# Patient Record
Sex: Female | Born: 1960
Health system: Southern US, Community
[De-identification: ages and names within clinical notes are randomized; demographics above are authoritative.]

## PROBLEM LIST (undated history)

## (undated) DIAGNOSIS — H9191 Unspecified hearing loss, right ear: Secondary | ICD-10-CM

## (undated) DIAGNOSIS — F32A Depression, unspecified: Secondary | ICD-10-CM

## (undated) DIAGNOSIS — S32599A Other specified fracture of unspecified pubis, initial encounter for closed fracture: Secondary | ICD-10-CM

## (undated) DIAGNOSIS — E785 Hyperlipidemia, unspecified: Secondary | ICD-10-CM

## (undated) DIAGNOSIS — M1990 Unspecified osteoarthritis, unspecified site: Secondary | ICD-10-CM

## (undated) DIAGNOSIS — F410 Panic disorder [episodic paroxysmal anxiety] without agoraphobia: Secondary | ICD-10-CM

## (undated) DIAGNOSIS — D126 Benign neoplasm of colon, unspecified: Secondary | ICD-10-CM

## (undated) DIAGNOSIS — F329 Major depressive disorder, single episode, unspecified: Secondary | ICD-10-CM

## (undated) DIAGNOSIS — B019 Varicella without complication: Secondary | ICD-10-CM

## (undated) DIAGNOSIS — K219 Gastro-esophageal reflux disease without esophagitis: Secondary | ICD-10-CM

## (undated) DIAGNOSIS — C4491 Basal cell carcinoma of skin, unspecified: Secondary | ICD-10-CM

## (undated) HISTORY — DX: Hyperlipidemia, unspecified: E78.5

## (undated) HISTORY — DX: Depression, unspecified: F32.A

## (undated) HISTORY — DX: Major depressive disorder, single episode, unspecified: F32.9

## (undated) HISTORY — DX: Varicella without complication: B01.9

## (undated) HISTORY — DX: Basal cell carcinoma of skin, unspecified: C44.91

## (undated) HISTORY — DX: Benign neoplasm of colon, unspecified: D12.6

## (undated) HISTORY — DX: Gastro-esophageal reflux disease without esophagitis: K21.9

## (undated) HISTORY — DX: Unspecified hearing loss, right ear: H91.91

## (undated) HISTORY — PX: DILATION AND CURETTAGE OF UTERUS: SHX78

---

## 1998-11-11 ENCOUNTER — Other Ambulatory Visit: Admission: RE | Admit: 1998-11-11 | Discharge: 1998-11-11 | Payer: Self-pay | Admitting: Obstetrics and Gynecology

## 1999-11-16 ENCOUNTER — Other Ambulatory Visit: Admission: RE | Admit: 1999-11-16 | Discharge: 1999-11-16 | Payer: Self-pay | Admitting: Obstetrics and Gynecology

## 2000-12-21 ENCOUNTER — Other Ambulatory Visit: Admission: RE | Admit: 2000-12-21 | Discharge: 2000-12-21 | Payer: Self-pay | Admitting: Obstetrics and Gynecology

## 2001-03-14 ENCOUNTER — Other Ambulatory Visit: Admission: RE | Admit: 2001-03-14 | Discharge: 2001-03-14 | Payer: Self-pay | Admitting: Obstetrics and Gynecology

## 2002-04-12 ENCOUNTER — Other Ambulatory Visit: Admission: RE | Admit: 2002-04-12 | Discharge: 2002-04-12 | Payer: Self-pay | Admitting: Obstetrics and Gynecology

## 2003-05-02 ENCOUNTER — Other Ambulatory Visit: Admission: RE | Admit: 2003-05-02 | Discharge: 2003-05-02 | Payer: Self-pay | Admitting: Obstetrics and Gynecology

## 2004-07-10 ENCOUNTER — Other Ambulatory Visit: Admission: RE | Admit: 2004-07-10 | Discharge: 2004-07-10 | Payer: Self-pay | Admitting: Obstetrics and Gynecology

## 2004-08-04 ENCOUNTER — Observation Stay (HOSPITAL_COMMUNITY): Admission: EM | Admit: 2004-08-04 | Discharge: 2004-08-05 | Payer: Self-pay | Admitting: Emergency Medicine

## 2005-07-19 ENCOUNTER — Other Ambulatory Visit: Admission: RE | Admit: 2005-07-19 | Discharge: 2005-07-19 | Payer: Self-pay | Admitting: Obstetrics and Gynecology

## 2006-03-15 ENCOUNTER — Encounter: Admission: RE | Admit: 2006-03-15 | Discharge: 2006-03-15 | Payer: Self-pay | Admitting: Family Medicine

## 2006-08-29 ENCOUNTER — Encounter: Admission: RE | Admit: 2006-08-29 | Discharge: 2006-08-29 | Payer: Self-pay | Admitting: Obstetrics and Gynecology

## 2006-10-24 ENCOUNTER — Ambulatory Visit: Payer: Self-pay | Admitting: Family Medicine

## 2007-07-10 ENCOUNTER — Ambulatory Visit: Payer: Self-pay | Admitting: Family Medicine

## 2008-03-25 ENCOUNTER — Encounter: Admission: RE | Admit: 2008-03-25 | Discharge: 2008-03-25 | Payer: Self-pay | Admitting: Obstetrics and Gynecology

## 2009-11-07 ENCOUNTER — Encounter: Admission: RE | Admit: 2009-11-07 | Discharge: 2009-11-07 | Payer: Self-pay | Admitting: Obstetrics and Gynecology

## 2010-02-04 ENCOUNTER — Ambulatory Visit: Payer: Self-pay | Admitting: Family Medicine

## 2010-12-07 ENCOUNTER — Ambulatory Visit: Payer: Self-pay | Admitting: Family Medicine

## 2011-01-10 ENCOUNTER — Encounter: Payer: Self-pay | Admitting: Obstetrics and Gynecology

## 2011-05-07 NOTE — H&P (Signed)
NAMEMarland Kitchen  Connie Knox, Connie Knox                         ACCOUNT NO.:  [id number]   MEDICAL RECORD NO.:  [id number]                   PATIENT TYPE:  INP   LOCATION:  0103                                 FACILITY:  [facility]   PHYSICIAN:  Charlies Constable, M.D. [facility]              DATE OF BIRTH:  19-Oct-1961   DATE OF ADMISSION:  08/04/2004  DATE OF DISCHARGE:                                HISTORY & PHYSICAL   CHIEF COMPLAINT:  Chest pain.   HISTORY OF PRESENT ILLNESS:  Connie Knox is 50 years old and has no prior  history of known heart disease.  Over the past week to two weeks she has  been having chest pain which she describes as a feeling of her son standing  on her chest.  The pain is mostly left-sided and does not have any  radiation.  She has had some associated nausea with this but no diaphoresis  or shortness of breath.  She came back from a trip this morning and while at  work she had recurrence of the pain and came to the emergency room.  She  recently found that she had a cholesterol in the 300 range with a high LDL.  This has caused her a great bit of anxiety and contributed to her concern  about her symptoms.   PAST MEDICAL HISTORY:  Mostly negative.  She has recently diagnosed  hyperlipidemia.   MEDICATIONS:  She is not on any medications.   She has had two normal childbirths.  She has two children with normal  pregnancies.   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  She  is divorced and has two children.  She is engaged to a gentleman from [place] who is moving down to [place].  She does not feel like she has  been under any increased stress recently.   FAMILY HISTORY:  Negative for any cardiac disease with the exception of her  grandfather who died of heart disease in his 55s.   REVIEW OF SYSTEMS:  Positive for right arm pain.   PHYSICAL EXAMINATION:  VITAL SIGNS:  Blood pressure was 141/85 and the pulse  73.  NECK:  There was no venous  distention.  The carotid pulses were full without  bruits.  CHEST:  Clear without rales or rhonchi.  CARDIAC:  Cardiac rhythm was regular.  The first and second heart sounds  were normal.  There was a mid systolic click at the apex but I could hear no  associated murmur.  ABDOMEN:  Soft without organomegaly.  EXTREMITIES:  Peripheral pulses were full.  There was no peripheral edema.  MUSCULOSKELETAL:  Showed no deformities.  SKIN:  Warm and dry.  NEUROLOGIC:  Examination showed no focal neurological findings.   LABORATORY DATA:  An ECG was normal.  Point of care markers were negative.   IMPRESSION:  1. Chest pain  of uncertain etiology, possibly related to mitral valve     prolapse, rule out ischemia.  2. Mid systolic click.  3. Elevated cholesterol.   RECOMMENDATIONS:  Will plan to admit the patient overnight for observation.  If serial enzymes and ECGs are normal and the pain settles down, will plan  to evaluate her as an outpatient with a stress Cardiolite and an  echocardiogram.  Will start her on Lipitor as well as some Xanax tonight and  also Protonix for her nausea and possible reflux symptoms.                                               Charlies Constable, M.D. [facility]    BB/MEDQ  D:  08/04/2004  T:  08/04/2004  Job:  [id number]   cc:   Guy Sandifer. Arleta Creek, M.D.  [address]  [place]  [place] [address]  Fax: [phone]   Jesse Sans. Wall, M.D.   Charlies Constable, M.D. [facility]

## 2011-05-07 NOTE — Discharge Summary (Signed)
NAMEMarland Kitchen  Connie Knox, Connie Knox                         ACCOUNT NO.:  [id number]   MEDICAL RECORD NO.:  [id number]                   PATIENT TYPE:  INP   LOCATION:  0361                                 FACILITY:  [facility]   PHYSICIAN:  Charlies Constable, M.D. [facility]              DATE OF BIRTH:  1961/10/15   DATE OF ADMISSION:  08/04/2004  DATE OF DISCHARGE:  08/05/2004                           DISCHARGE SUMMARY - REFERRING   PROCEDURES:  None.   REASON FOR ADMISSION:  The patient is a 50 year old female with no prior  cardiac history, admitted with a 2 week history of chest pain.  Please refer  to admission note for full details.   LABORATORY DATA:  WBC 7.9, hemoglobin 14.6, hematocrit 42, platelet 239.  Sodium 138, potassium 3.7, BUN 16, creatinine 0.9, glucose 91, liver enzymes  normal.  Cardiac enzymes:  Troponin I 0.05 (x 2), 0.01, CPK 72/0.7.   Admission CXR:  NAD; question COPD.   HOSPITAL COURSE:  Following presentation to [facility] Emergency Room, the  patient was admitted for overnight observation and further diagnostic  evaluation.   Serial cardiac markers were all within normal limits.  Serial EKGs were  negative.   On admission, Dr. Juanda Chance questioned whether the etiology might be secondary  to mitral valve prolapse, and he noted a mid systolic click but no murmur.  Recommendation was to follow up with an outpatient 2 D echocardiogram.   The patient also presented with history of hypercholesterolemia (reportedly  300).  Dr. Juanda Chance started her on Lipitor 40 daily.   The patient was cleared for discharge the following morning.  Arrangements  will be made for follow up outpatient stress Cardiolite and 2 D  echocardiogram.   The patient was not on any medications prior to admission.  She will be  discharged on baby aspirin, Protonix, Lipitor 40 daily, and Nitrostat p.r.n.   DISCHARGE INSTRUCTIONS:  1. Exercise stress Cardiolite test on Monday, August 22, at 12:30 p.m.  2. 2 D  echocardiogram on September 14, at 9:30 a.m.  Follow up with Dr. Juanito Doom that same day at noon.   DISCHARGE DIAGNOSES:  1. Chest pain.     a. Normal serial cardiac markers.     b. Arrange follow up stress Cardiolite.  2. Question mitral valve prolapse.  3. Hypercholesterolemia.     Gene Serpe, P.A. [facility]                      Charlies Constable, M.D. [facility]    GS/MEDQ  D:  08/05/2004  T:  08/05/2004  Job:  [id number]

## 2011-05-07 NOTE — Discharge Summary (Signed)
NAMEMarland Kitchen  Connie Knox, Connie Knox                         ACCOUNT NO.:  [id number]   MEDICAL RECORD NO.:  [id number]                   PATIENT TYPE:  INP   LOCATION:  0361                                 FACILITY:  [facility]   PHYSICIAN:  Charlies Constable, M.D. [facility]              DATE OF BIRTH:  05-03-1961   DATE OF ADMISSION:  08/04/2004  DATE OF DISCHARGE:  08/05/2004                           DISCHARGE SUMMARY - REFERRING   ADDENDUM:     Gene Serpe, P.A. [facility]                      Charlies Constable, M.D. [facility]    GS/MEDQ  D:  08/05/2004  T:  08/05/2004  Job:  [id number]   cc:   Guy Sandifer. Arleta Creek, M.D.  [address]  [place]  [place] [address]  Fax: [phone]

## 2011-06-06 ENCOUNTER — Emergency Department (INDEPENDENT_AMBULATORY_CARE_PROVIDER_SITE_OTHER): Payer: BC Managed Care – PPO

## 2011-06-06 ENCOUNTER — Emergency Department (HOSPITAL_BASED_OUTPATIENT_CLINIC_OR_DEPARTMENT_OTHER)
Admission: EM | Admit: 2011-06-06 | Discharge: 2011-06-06 | Disposition: A | Discharge status: Home / Self Care | Payer: BC Managed Care – PPO | Attending: Emergency Medicine | Admitting: Emergency Medicine

## 2011-06-06 DIAGNOSIS — E7800 Pure hypercholesterolemia, unspecified: Secondary | ICD-10-CM | POA: Insufficient documentation

## 2011-06-06 DIAGNOSIS — Y9239 Other specified sports and athletic area as the place of occurrence of the external cause: Secondary | ICD-10-CM | POA: Insufficient documentation

## 2011-06-06 DIAGNOSIS — Y92838 Other recreation area as the place of occurrence of the external cause: Secondary | ICD-10-CM | POA: Insufficient documentation

## 2011-06-06 DIAGNOSIS — IMO0002 Reserved for concepts with insufficient information to code with codable children: Secondary | ICD-10-CM

## 2011-06-06 DIAGNOSIS — S61409A Unspecified open wound of unspecified hand, initial encounter: Secondary | ICD-10-CM

## 2011-06-06 DIAGNOSIS — S61209A Unspecified open wound of unspecified finger without damage to nail, initial encounter: Secondary | ICD-10-CM | POA: Insufficient documentation

## 2011-06-11 ENCOUNTER — Encounter: Payer: Self-pay | Admitting: Family Medicine

## 2011-06-11 DIAGNOSIS — K219 Gastro-esophageal reflux disease without esophagitis: Secondary | ICD-10-CM | POA: Insufficient documentation

## 2011-06-11 DIAGNOSIS — E785 Hyperlipidemia, unspecified: Secondary | ICD-10-CM | POA: Insufficient documentation

## 2011-06-14 ENCOUNTER — Ambulatory Visit (INDEPENDENT_AMBULATORY_CARE_PROVIDER_SITE_OTHER): Payer: BC Managed Care – PPO | Admitting: Medical

## 2011-06-14 ENCOUNTER — Encounter: Payer: Self-pay | Admitting: Medical

## 2011-06-14 VITALS — BP 112/80 | HR 62 | Temp 98.0°F | Ht 67.0 in | Wt 150.0 lb

## 2011-06-14 DIAGNOSIS — S61209A Unspecified open wound of unspecified finger without damage to nail, initial encounter: Secondary | ICD-10-CM

## 2011-06-14 DIAGNOSIS — Z4802 Encounter for removal of sutures: Secondary | ICD-10-CM

## 2011-06-14 DIAGNOSIS — S61219A Laceration without foreign body of unspecified finger without damage to nail, initial encounter: Secondary | ICD-10-CM

## 2011-06-14 MED ORDER — CEPHALEXIN 500 MG PO CAPS: ORAL_CAPSULE | ORAL | Status: AC
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
## 2011-07-08 ENCOUNTER — Other Ambulatory Visit: Payer: Self-pay | Admitting: Obstetrics and Gynecology

## 2011-07-08 DIAGNOSIS — Z1231 Encounter for screening mammogram for malignant neoplasm of breast: Secondary | ICD-10-CM

## 2011-07-15 ENCOUNTER — Inpatient Hospital Stay: Admission: RE | Admit: 2011-07-15 | Payer: BC Managed Care – PPO | Source: Ambulatory Visit

## 2011-07-16 ENCOUNTER — Ambulatory Visit
Admission: RE | Admit: 2011-07-16 | Discharge: 2011-07-16 | Disposition: A | Discharge status: Home / Self Care | Payer: BC Managed Care – PPO | Source: Ambulatory Visit | Attending: Obstetrics and Gynecology | Admitting: Obstetrics and Gynecology

## 2011-07-16 DIAGNOSIS — Z1231 Encounter for screening mammogram for malignant neoplasm of breast: Secondary | ICD-10-CM

## 2013-04-11 ENCOUNTER — Other Ambulatory Visit: Payer: Self-pay

## 2013-04-11 DIAGNOSIS — Z1231 Encounter for screening mammogram for malignant neoplasm of breast: Secondary | ICD-10-CM

## 2013-05-18 ENCOUNTER — Ambulatory Visit
Admission: RE | Admit: 2013-05-18 | Discharge: 2013-05-18 | Disposition: A | Discharge status: Home / Self Care | Payer: BC Managed Care – PPO | Source: Ambulatory Visit

## 2013-05-18 DIAGNOSIS — Z1231 Encounter for screening mammogram for malignant neoplasm of breast: Secondary | ICD-10-CM

## 2014-01-17 ENCOUNTER — Ambulatory Visit (INDEPENDENT_AMBULATORY_CARE_PROVIDER_SITE_OTHER): Payer: BC Managed Care – PPO | Admitting: Family Medicine

## 2014-01-17 ENCOUNTER — Encounter: Payer: Self-pay | Admitting: Family Medicine

## 2014-01-17 VITALS — BP 110/70 | HR 60 | Wt 147.0 lb

## 2014-01-17 DIAGNOSIS — Z639 Problem related to primary support group, unspecified: Secondary | ICD-10-CM

## 2014-01-17 DIAGNOSIS — J019 Acute sinusitis, unspecified: Secondary | ICD-10-CM

## 2014-01-17 DIAGNOSIS — F439 Reaction to severe stress, unspecified: Secondary | ICD-10-CM

## 2014-01-17 MED ORDER — AMOXICILLIN 875 MG PO TABS: 875.0000 mg | ORAL_TABLET | Freq: Two times a day (BID) | ORAL | Status: DC
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
## 2014-01-17 NOTE — Patient Instructions (Signed)
Take all the antibiotic and if not totally back to normal when you finish give me a call. Take Advil or Aleve for the discomfort

## 2014-06-13 ENCOUNTER — Emergency Department (HOSPITAL_COMMUNITY)
Admission: EM | Admit: 2014-06-13 | Discharge: 2014-06-13 | Disposition: A | Discharge status: Home / Self Care | Payer: BC Managed Care – PPO | Attending: Emergency Medicine | Admitting: Emergency Medicine

## 2014-06-13 ENCOUNTER — Encounter (HOSPITAL_COMMUNITY): Payer: Self-pay | Admitting: Emergency Medicine

## 2014-06-13 ENCOUNTER — Emergency Department (HOSPITAL_COMMUNITY): Payer: BC Managed Care – PPO

## 2014-06-13 DIAGNOSIS — Z862 Personal history of diseases of the blood and blood-forming organs and certain disorders involving the immune mechanism: Secondary | ICD-10-CM | POA: Insufficient documentation

## 2014-06-13 DIAGNOSIS — Z8719 Personal history of other diseases of the digestive system: Secondary | ICD-10-CM | POA: Insufficient documentation

## 2014-06-13 DIAGNOSIS — R0602 Shortness of breath: Secondary | ICD-10-CM | POA: Insufficient documentation

## 2014-06-13 DIAGNOSIS — Z79899 Other long term (current) drug therapy: Secondary | ICD-10-CM | POA: Insufficient documentation

## 2014-06-13 DIAGNOSIS — R0789 Other chest pain: Secondary | ICD-10-CM | POA: Insufficient documentation

## 2014-06-13 DIAGNOSIS — Z8639 Personal history of other endocrine, nutritional and metabolic disease: Secondary | ICD-10-CM | POA: Insufficient documentation

## 2014-06-13 DIAGNOSIS — R079 Chest pain, unspecified: Secondary | ICD-10-CM

## 2014-06-13 DIAGNOSIS — Z8659 Personal history of other mental and behavioral disorders: Secondary | ICD-10-CM | POA: Insufficient documentation

## 2014-06-13 DIAGNOSIS — R209 Unspecified disturbances of skin sensation: Secondary | ICD-10-CM | POA: Insufficient documentation

## 2014-06-13 HISTORY — DX: Panic disorder (episodic paroxysmal anxiety): F41.0

## 2014-06-13 LAB — CBC
HCT: 39.5 % (ref 36.0–46.0)
HEMOGLOBIN: 13.2 g/dL (ref 12.0–15.0)
MCH: 32.7 pg (ref 26.0–34.0)
MCHC: 33.4 g/dL (ref 30.0–36.0)
MCV: 97.8 fL (ref 78.0–100.0)
PLATELETS: 246 10*3/uL (ref 150–400)
RBC: 4.04 MIL/uL (ref 3.87–5.11)
RDW: 12.3 % (ref 11.5–15.5)
WBC: 6.6 10*3/uL (ref 4.0–10.5)

## 2014-06-13 LAB — BASIC METABOLIC PANEL
BUN: 14 mg/dL (ref 6–23)
CO2: 28 mEq/L (ref 19–32)
CREATININE: 1.07 mg/dL (ref 0.50–1.10)
Calcium: 9.9 mg/dL (ref 8.4–10.5)
Chloride: 98 mEq/L (ref 96–112)
GFR calc non Af Amer: 58 mL/min — ABNORMAL LOW (ref >90)
GFR, EST AFRICAN AMERICAN: 67 mL/min — AB (ref >90)
Glucose, Bld: 103 mg/dL — ABNORMAL HIGH (ref 70–99)
Potassium: 4 mEq/L (ref 3.7–5.3)
SODIUM: 141 meq/L (ref 137–147)

## 2014-06-13 LAB — I-STAT TROPONIN, ED
TROPONIN I, POC: 0 ng/mL (ref 0.00–0.08)
TROPONIN I, POC: 0 ng/mL (ref 0.00–0.08)

## 2014-06-13 MED ORDER — IOHEXOL 350 MG/ML SOLN
100.0000 mL | Freq: Once | INTRAVENOUS | Status: AC | PRN
  Administered 2014-06-13: 100 mL via INTRAVENOUS

## 2014-06-13 NOTE — ED Notes (Signed)
Pt c/o L sided chest pain and sob since Sat.  States nothing increases or decreases pain, it is constant.  Pt does have hx of a panic attack.

## 2014-06-13 NOTE — ED Provider Notes (Signed)
CSN: [id number]     Arrival date & time 06/13/14  1618 History   First MD Initiated Contact with Patient 06/13/14 2003     Chief Complaint  Patient presents with  . Chest Pain  . Shortness of Breath     (Consider location/radiation/quality/duration/timing/severity/associated sxs/prior Treatment) Patient is a 53 y.o. female presenting with chest pain.  Chest Pain Pain location:  L chest Pain quality: sharp   Pain radiates to the back: yes   Pain severity:  Mild Onset quality:  Sudden Duration:  5 days Timing:  Intermittent Chronicity:  New Relieved by:  None tried Worsened by:  Nothing tried Ineffective treatments:  None tried Associated symptoms: numbness (left arm) and shortness of breath   Associated symptoms: no abdominal pain, no back pain, no cough, no dizziness, no fever, no headache, no palpitations, no syncope and no weakness     Past Medical History  Diagnosis Date  . GERD (gastroesophageal reflux disease)   . Dyslipidemia   . Panic attack    History reviewed. No pertinent past surgical history. Family History  Problem Relation Age of Onset  . Cancer Maternal Grandmother   . Cancer Maternal Grandfather   . Stroke Maternal Grandfather   . Ulcers Maternal Grandfather   . Cancer Paternal Grandmother   . Cancer Paternal Grandfather    History  Substance Use Topics  . Smoking status: Never Smoker   . Smokeless tobacco: Never Used  . Alcohol Use: Yes     Comment: 16 drinks a month   OB History   Grav Para Term Preterm Abortions TAB SAB Ect Mult Living                 Review of Systems  Constitutional: Negative for fever and activity change.  HENT: Negative for congestion and facial swelling.   Eyes: Negative for discharge and redness.  Respiratory: Positive for shortness of breath. Negative for cough.   Cardiovascular: Positive for chest pain. Negative for palpitations and syncope.  Gastrointestinal: Negative for abdominal pain and abdominal  distention.  Endocrine: Negative for polydipsia and polyuria.  Genitourinary: Negative for dysuria and menstrual problem.  Musculoskeletal: Negative for back pain and joint swelling.  Skin: Negative for color change and wound.  Neurological: Positive for numbness (left arm). Negative for dizziness, weakness, light-headedness and headaches.      Allergies  Review of patient's allergies indicates no known allergies.  Home Medications   Prior to Admission medications   Medication Sig Start Date End Date Taking? Authorizing Provider  ibuprofen (ADVIL,MOTRIN) 200 MG tablet Take 1,400 mg by mouth every 6 (six) hours as needed.   Yes Historical Provider, MD  naproxen sodium (ANAPROX) 220 MG tablet Take 440 mg by mouth daily as needed (for pain).   Yes Historical Provider, MD  zolpidem (AMBIEN) 5 MG tablet Take 5 mg by mouth daily at 6 PM. 05/08/14  Yes Historical Provider, MD   BP 120/68  Pulse 62  Temp(Src) 97.7 F (36.5 C) (Oral)  Resp 18  Ht 5' 5.75" (1.67 m)  Wt 151 lb (68.493 kg)  BMI 24.56 kg/m2  SpO2 99% Physical Exam  Nursing note and vitals reviewed. Constitutional: She is oriented to person, place, and time. She appears well-developed and well-nourished.  HENT:  Head: Normocephalic and atraumatic.  Eyes: Conjunctivae and EOM are normal. Right eye exhibits no discharge. Left eye exhibits no discharge.  Cardiovascular: Normal rate and regular rhythm.   Pulmonary/Chest: Effort normal and breath sounds normal. No  respiratory distress. She exhibits no tenderness.  Abdominal: Soft. She exhibits no distension. There is no tenderness. There is no rebound.  Musculoskeletal: Normal range of motion. She exhibits no edema and no tenderness.  Neurological: She is alert and oriented to person, place, and time.  Skin: Skin is warm and dry.    ED Course  Procedures (including critical care time) Labs Review Labs Reviewed  BASIC METABOLIC PANEL - Abnormal; Notable for the following:     Glucose, Bld 103 (*)    GFR calc non Af Amer 58 (*)    GFR calc Af Amer 67 (*)    All other components within normal limits  CBC  I-STAT TROPOININ, ED  I-STAT TROPOININ, ED    Imaging Review Dg Chest 2 View  06/13/2014   CLINICAL DATA:  Left-sided chest pain  EXAM: CHEST  2 VIEW  COMPARISON:  08/04/2004  FINDINGS: The heart size and mediastinal contours are within normal limits. Both lungs are clear. The visualized skeletal structures are unremarkable.  IMPRESSION: No active cardiopulmonary disease.   Electronically Signed   By: Kathreen Devoid   On: 06/13/2014 18:36   Ct Angio Chest Aorta W/cm &/or Wo/cm  06/13/2014   CLINICAL DATA:  Left-sided chest pain and shortness of breath since Saturday. Constant pain.  EXAM: CT ANGIOGRAPHY CHEST WITH CONTRAST  TECHNIQUE: Multidetector CT imaging of the chest was performed using the standard protocol during bolus administration of intravenous contrast. Multiplanar CT image reconstructions and MIPs were obtained to evaluate the vascular anatomy.  CONTRAST:  135mL OMNIPAQUE IOHEXOL 350 MG/ML SOLN  COMPARISON:  None.  FINDINGS: Unenhanced images of the chest demonstrate normal caliber thoracic aorta. Minimal Coronary artery calcification. No evidence of intramural hematoma. Bilateral breast implants.  Contrast-enhanced CT angiographic images demonstrate normal caliber thoracic aorta. No evidence of aneurysm or dissection. Great vessel origins are patent. Visualized central pulmonary arteries are well opacified. No evidence of significant pulmonary embolus.  The heart size is normal. Esophagus is mostly decompressed. No significant lymphadenopathy in the chest. No pleural effusions. Visualized portions of the upper abdominal organs are grossly unremarkable.  Lungs are clear. No evidence of focal airspace disease or consolidation. No pneumothorax. Airways appear patent.  Normal alignment of the thoracic spine with mild endplate hypertrophic changes. No vertebral  compression deformities. No displaced rib fractures appreciated.  Review of the MIP images confirms the above findings.  IMPRESSION: No evidence of thoracic aortic aneurysm or dissection.   Electronically Signed   By: Lucienne Capers M.D.   On: 06/13/2014 21:46     EKG Interpretation None      MDM   Final diagnoses:  Chest pain, unspecified chest pain type    53 yo F w/ nonspecific left sided chest pain since Saturday described as sharp and radiates to her back. Had a couple days without chest pain earlier, but returned Wednesday evening. Mild associated sob. No n/v, cough, fevers or otherwise. Monday with onset of left arm and hand numbness. Here with mildly decreased sensation to whole left arm but can still feel normally. VS WNL. Rest of exam normal as above.  Delta troponins negative, will fu w/ PCP/cardiology tomorrow and attempt to get stress within a couple weeks. With neuro symptoms and chest pain, ct chest angio done to eval for dissection and was negative.  Doubt PE and other emergent causes were appropriately screened for.  Appropriate for discharge home with close pcp and subsequent cardiology follow up.     Corene Cornea  Mesner, MD 06/14/14 [id number]

## 2014-06-15 NOTE — ED Provider Notes (Signed)
I saw and evaluated the patient, reviewed the resident's note and I agree with the findings and plan.   EKG Interpretation   Date/Time:  Thursday June 13 2014 16:31:07 EDT Ventricular Rate:  91 PR Interval:  144 QRS Duration: 84 QT Interval:  370 QTC Calculation: 455 R Axis:   80 Text Interpretation:  Normal sinus rhythm with sinus arrhythmia  Nonspecific ST and T wave abnormality Abnormal ECG Confirmed by Alvino Chapel   MD, Ovid Curd [phone]) on 06/15/2014 7:57:28 AM     Patient with chest pain since Saturday. Returned prior to arrival. Are reassuring he EKG CT chest x-ray and troponins. Will discharge home to follow with cardiology  Jasper Riling. Alvino Chapel, MD 06/15/14 [phone]

## 2015-06-18 ENCOUNTER — Other Ambulatory Visit: Payer: Self-pay | Admitting: Obstetrics and Gynecology

## 2015-06-19 LAB — CYTOLOGY - PAP

## 2016-05-11 DIAGNOSIS — S32599A Other specified fracture of unspecified pubis, initial encounter for closed fracture: Secondary | ICD-10-CM

## 2016-05-11 HISTORY — DX: Other specified fracture of unspecified pubis, initial encounter for closed fracture: S32.599A

## 2016-05-12 ENCOUNTER — Emergency Department (HOSPITAL_COMMUNITY): Payer: 59

## 2016-05-12 ENCOUNTER — Encounter (HOSPITAL_COMMUNITY): Payer: Self-pay | Admitting: Emergency Medicine

## 2016-05-12 ENCOUNTER — Observation Stay (HOSPITAL_COMMUNITY)
Admission: EM | Admit: 2016-05-12 | Discharge: 2016-05-15 | Disposition: A | Discharge status: Home Health | Payer: 59 | Attending: Family Medicine | Admitting: Family Medicine

## 2016-05-12 DIAGNOSIS — W109XXA Fall (on) (from) unspecified stairs and steps, initial encounter: Secondary | ICD-10-CM | POA: Diagnosis not present

## 2016-05-12 DIAGNOSIS — K5900 Constipation, unspecified: Secondary | ICD-10-CM | POA: Diagnosis not present

## 2016-05-12 DIAGNOSIS — F17200 Nicotine dependence, unspecified, uncomplicated: Secondary | ICD-10-CM | POA: Insufficient documentation

## 2016-05-12 DIAGNOSIS — S3210XA Unspecified fracture of sacrum, initial encounter for closed fracture: Secondary | ICD-10-CM | POA: Insufficient documentation

## 2016-05-12 DIAGNOSIS — W19XXXA Unspecified fall, initial encounter: Secondary | ICD-10-CM | POA: Diagnosis not present

## 2016-05-12 DIAGNOSIS — M1990 Unspecified osteoarthritis, unspecified site: Secondary | ICD-10-CM | POA: Diagnosis not present

## 2016-05-12 DIAGNOSIS — R262 Difficulty in walking, not elsewhere classified: Secondary | ICD-10-CM | POA: Insufficient documentation

## 2016-05-12 DIAGNOSIS — F32A Depression, unspecified: Secondary | ICD-10-CM

## 2016-05-12 DIAGNOSIS — S32592A Other specified fracture of left pubis, initial encounter for closed fracture: Principal | ICD-10-CM | POA: Insufficient documentation

## 2016-05-12 DIAGNOSIS — S32599A Other specified fracture of unspecified pubis, initial encounter for closed fracture: Secondary | ICD-10-CM | POA: Diagnosis present

## 2016-05-12 DIAGNOSIS — G4700 Insomnia, unspecified: Secondary | ICD-10-CM | POA: Diagnosis not present

## 2016-05-12 DIAGNOSIS — K219 Gastro-esophageal reflux disease without esophagitis: Secondary | ICD-10-CM | POA: Diagnosis not present

## 2016-05-12 DIAGNOSIS — F329 Major depressive disorder, single episode, unspecified: Secondary | ICD-10-CM | POA: Diagnosis not present

## 2016-05-12 DIAGNOSIS — R52 Pain, unspecified: Secondary | ICD-10-CM

## 2016-05-12 HISTORY — DX: Unspecified osteoarthritis, unspecified site: M19.90

## 2016-05-12 HISTORY — DX: Other specified fracture of unspecified pubis, initial encounter for closed fracture: S32.599A

## 2016-05-12 MED ORDER — MORPHINE SULFATE (PF) 4 MG/ML IV SOLN
4.0000 mg | INTRAVENOUS | Status: AC | PRN
  Administered 2016-05-12 (×3): 4 mg via INTRAVENOUS
  Filled 2016-05-12 (×3): qty 1

## 2016-05-12 MED ORDER — DIAZEPAM 5 MG/ML IJ SOLN
5.0000 mg | Freq: Once | INTRAMUSCULAR | Status: AC
  Administered 2016-05-12: 5 mg via INTRAVENOUS
  Filled 2016-05-12: qty 2

## 2016-05-12 MED ORDER — SODIUM CHLORIDE 0.9% FLUSH: 3.0000 mL | INTRAVENOUS | Status: DC | PRN

## 2016-05-12 MED ORDER — SENNA 8.6 MG PO TABS
1.0000 | ORAL_TABLET | Freq: Two times a day (BID) | ORAL | Status: DC
  Administered 2016-05-12 – 2016-05-15 (×5): 8.6 mg via ORAL
  Filled 2016-05-12 (×7): qty 1

## 2016-05-12 MED ORDER — ACETAMINOPHEN 650 MG RE SUPP: 650.0000 mg | Freq: Four times a day (QID) | RECTAL | Status: DC | PRN

## 2016-05-12 MED ORDER — ONDANSETRON HCL 4 MG/2ML IJ SOLN
4.0000 mg | Freq: Once | INTRAMUSCULAR | Status: AC
  Administered 2016-05-12: 4 mg via INTRAVENOUS
  Filled 2016-05-12: qty 2

## 2016-05-12 MED ORDER — SERTRALINE HCL 50 MG PO TABS
50.0000 mg | ORAL_TABLET | Freq: Every day | ORAL | Status: DC
  Administered 2016-05-12 – 2016-05-14 (×3): 50 mg via ORAL
  Filled 2016-05-12 (×4): qty 1

## 2016-05-12 MED ORDER — ACETAMINOPHEN 325 MG PO TABS
650.0000 mg | ORAL_TABLET | Freq: Four times a day (QID) | ORAL | Status: DC | PRN
Start: 2016-05-12 — End: 2016-05-15

## 2016-05-12 MED ORDER — POLYETHYLENE GLYCOL 3350 17 G PO PACK
17.0000 g | PACK | Freq: Two times a day (BID) | ORAL | Status: DC
  Administered 2016-05-12 – 2016-05-15 (×4): 17 g via ORAL
  Filled 2016-05-12 (×7): qty 1

## 2016-05-12 MED ORDER — ONDANSETRON HCL 4 MG/2ML IJ SOLN
4.0000 mg | Freq: Four times a day (QID) | INTRAMUSCULAR | Status: DC | PRN
  Administered 2016-05-13: 4 mg via INTRAVENOUS
  Filled 2016-05-12: qty 2

## 2016-05-12 MED ORDER — ONDANSETRON HCL 4 MG PO TABS: 4.0000 mg | ORAL_TABLET | Freq: Four times a day (QID) | ORAL | Status: DC | PRN

## 2016-05-12 MED ORDER — SODIUM CHLORIDE 0.9 % IV SOLN: 250.0000 mL | INTRAVENOUS | Status: DC | PRN

## 2016-05-12 MED ORDER — HYDROMORPHONE HCL 1 MG/ML IJ SOLN
1.0000 mg | INTRAMUSCULAR | Status: DC | PRN
  Administered 2016-05-12 – 2016-05-13 (×2): 1 mg via INTRAVENOUS
  Filled 2016-05-12 (×2): qty 1

## 2016-05-12 MED ORDER — OXYCODONE HCL 5 MG PO TABS
5.0000 mg | ORAL_TABLET | ORAL | Status: DC | PRN
  Administered 2016-05-13: 5 mg via ORAL
  Filled 2016-05-12: qty 1

## 2016-05-12 MED ORDER — HYDROMORPHONE HCL 1 MG/ML IJ SOLN
0.5000 mg | INTRAMUSCULAR | Status: DC | PRN
  Administered 2016-05-12: 0.5 mg via INTRAVENOUS
  Filled 2016-05-12 (×2): qty 1

## 2016-05-12 MED ORDER — SODIUM CHLORIDE 0.9% FLUSH
3.0000 mL | Freq: Two times a day (BID) | INTRAVENOUS | Status: DC
  Administered 2016-05-12 – 2016-05-15 (×6): 3 mL via INTRAVENOUS

## 2016-05-12 MED ORDER — ASPIRIN 81 MG PO CHEW
324.0000 mg | CHEWABLE_TABLET | Freq: Every day | ORAL | Status: DC
  Administered 2016-05-13 – 2016-05-15 (×4): 324 mg via ORAL
  Filled 2016-05-12 (×4): qty 4

## 2016-05-12 MED ORDER — ZOLPIDEM TARTRATE 5 MG PO TABS: 5.0000 mg | ORAL_TABLET | Freq: Every evening | ORAL | Status: DC | PRN

## 2016-05-12 NOTE — ED Notes (Addendum)
States was chasing her dog last night and missed a step and fell down about 15 stairs hurting left side and leg cannot bear wt on left leg and paion is in hip and groin

## 2016-05-12 NOTE — ED Notes (Signed)
Pt taken to XRay 

## 2016-05-12 NOTE — Consult Note (Signed)
   ORTHOPAEDIC CONSULTATION  REQUESTING PHYSICIAN: David J Merrell, MD  Chief Complaint: Posterior and left pelvic fracture  HPI: Connie Knox is a 55 y.o. female who complains of  last night she was walking down the stairs in the dark missed a step and tumbled down several stairs bouncing on her left side. She's had significant pain in her pelvis anteriorly and posteriorly she's been unable to mobilize she was admitted for pain control and mobilization. She denies any other complaints. She is a community ambulator with no assistive devices plays golf regularly  Past Medical History  Diagnosis Date  . GERD (gastroesophageal reflux disease)   . Dyslipidemia   . Panic attack   . Pubic ramus fracture (HCC) 05/11/2016    "fell down carpeted steps"  . Arthritis     "knees" (05/12/2016)   Past Surgical History  Procedure Laterality Date  . Dilation and curettage of uterus  X 9    "all miscarriages"   Social History   Social History  . Marital Status: Married    Spouse Name: N/A  . Number of Children: N/A  . Years of Education: N/A   Social History Main Topics  . Smoking status: Former Smoker  . Smokeless tobacco: Never Used     Comment: "smoked cigarettes as a teen"  . Alcohol Use: 8.4 oz/week    14 Shots of liquor per week     Comment: 05/12/2016 "2 shots vodka/day"  . Drug Use: No  . Sexual Activity: Yes   Other Topics Concern  . None   Social History Narrative   Family History  Problem Relation Age of Onset  . Cancer Maternal Grandmother   . Cancer Maternal Grandfather   . Stroke Maternal Grandfather   . Ulcers Maternal Grandfather   . Cancer Paternal Grandmother   . Cancer Paternal Grandfather    Allergies  Allergen Reactions  . Aleve [Naproxen Sodium] Palpitations   Prior to Admission medications   Medication Sig Start Date End Date Taking? Authorizing Provider  HYDROcodone-acetaminophen (NORCO/VICODIN) 5-325 MG tablet Take 1 tablet by mouth every  6 (six) hours as needed for moderate pain.   Yes Historical Provider, MD  ibuprofen (ADVIL,MOTRIN) 200 MG tablet Take 400 mg by mouth every 6 (six) hours as needed for moderate pain.    Yes Historical Provider, MD  sertraline (ZOLOFT) 50 MG tablet Take 50 mg by mouth daily. 04/17/16  Yes Historical Provider, MD  zolpidem (AMBIEN) 10 MG tablet Take 10 mg by mouth at bedtime as needed. sleep 02/13/16  Yes Historical Provider, MD   Dg Lumbar Spine 2-3 Views  05/12/2016  CLINICAL DATA:  55-year-old female with pain after fall. Initial encounter. EXAM: LUMBAR SPINE - 2-3 VIEW COMPARISON:  Left hip series from today reported separately. FINDINGS: Lateral view was done with supine cross-table lateral technique. Normal lumbar segmentation. Preserved lumbar vertebral height and alignment. Relatively preserved disc spaces. Bone mineralization is within normal limits. Sacrum appears grossly intact. SI joints appear normal. Visible lower thoracic levels appear intact. Left pubic rami fractures partially re- demonstrated. IMPRESSION: 1.  No acute fracture or listhesis identified in the lumbar spine. 2. Left pubic rami fractures, see left hip series. Electronically Signed   By: H  Hall M.D.   On: 05/12/2016 10:15   Ct Pelvis Wo Contrast  05/12/2016  CLINICAL DATA:  Fall down stairs last night with left hip pain and limited weight-bearing. Pubic rami fractures on radiographs. EXAM: CT PELVIS WITHOUT CONTRAST TECHNIQUE:   Multidetector CT imaging of the pelvis was performed following the standard protocol without intravenous contrast. COMPARISON:  Radiograph same date. FINDINGS: Urinary Tract: The visualized distal ureters appear normal. No evidence of bladder injury related to the pubic rami fractures. There is no free pelvic fluid. Bowel: No bowel wall thickening, distention or surrounding inflammation identified within the pelvis. Moderate stool throughout the colon. The appendix appears normal. Vascular/Lymphatic: No  enlarged pelvic lymph nodes identified. No significant vascular findings. Reproductive: The uterus and ovaries appear normal. No evidence of adnexal mass. Other: No free pelvic fluid. Musculoskeletal: As demonstrated on earlier radiographs, there are fractures of the left superior and inferior pubic rami. The superior pubic ramus fracture demonstrates up to 6 mm of displacement with a component extending towards the bladder. No definite bladder injury. There is a nondisplaced fracture of the left sacral ala. No right-sided pelvic fractures identified. The acetabula, symphysis pubis and sacroiliac joints appear intact. The proximal femurs are intact. There is an os acetabuli on the left. There is mild soft tissue swelling around the fractures, but no significant hematoma. IMPRESSION: 1. Left superior and inferior pubic rami fractures as described. The superior pubic ramus fracture is displaced. 2. Nondisplaced fractures of the left sacral ala. 3. No evidence of bladder injury. Electronically Signed   By: William  Veazey M.D.   On: 05/12/2016 12:02   Dg Hip Unilat With Pelvis 2-3 Views Left  05/12/2016  CLINICAL DATA:  55-year-old female with left hip pain after fall. Initial encounter. EXAM: DG HIP (WITH OR WITHOUT PELVIS) 2-3V LEFT COMPARISON:  None. FINDINGS: Comminuted fractures of the lateral aspect of the left superior pubic ramus and more central aspect of the left inferior pubic ramus. The left acetabulum appears spared. The femoral heads remain normally located. The proximal left femur appears intact. No other acute pelvic fracture identified. Grossly intact sacral ala. Proximal right femur appears intact. Underlying bone mineralization appears normal. IMPRESSION: Comminuted fractures of the left superior and inferior pubic rami. Proximal left femur appears intact. Electronically Signed   By: H  Hall M.D.   On: 05/12/2016 10:13   Dg Femur Min 2 Views Left  05/12/2016  CLINICAL DATA:  55-year-old female  with pain after fall. Initial encounter. EXAM: LEFT FEMUR 2 VIEWS COMPARISON:  Left hip series from today reported separately. FINDINGS: Partially re- demonstrated left pubic rami fractures. The proximal left femur was included on the comparison. The left femur intertrochanteric region, left femoral shaft, and distal left femur are intact. Alignment at the left knee is preserved. No left knee joint effusion identified. IMPRESSION: 1.  No acute fracture or dislocation identified in the left femur. 2. Left pubic rami fractures, see left hip series. Electronically Signed   By: H  Hall M.D.   On: 05/12/2016 10:16    Positive ROS: All other systems have been reviewed and were otherwise negative with the exception of those mentioned in the HPI and as above.  Labs cbc No results for input(s): WBC, HGB, HCT, PLT in the last 72 hours.  Labs inflam No results for input(s): CRP in the last 72 hours.  Invalid input(s): ESR  Labs coag No results for input(s): INR, PTT in the last 72 hours.  Invalid input(s): PT  No results for input(s): NA, K, CL, CO2, GLUCOSE, BUN, CREATININE, CALCIUM in the last 72 hours.  Physical Exam: Filed Vitals:   05/12/16 1415 05/12/16 1504  BP: 117/59 114/63  Pulse: 86 64  Temp:  98.3 F (36.8 C)    Resp:  16   General: Alert, no acute distress Cardiovascular: No pedal edema Respiratory: No cyanosis, no use of accessory musculature GI: No organomegaly, abdomen is soft and non-tender Skin: No lesions in the area of chief complaint other than those listed below in MSK exam.  Neurologic: Sensation intact distally save for the below mentioned MSK exam Psychiatric: Patient is competent for consent with normal mood and affect Lymphatic: No axillary or cervical lymphadenopathy  MUSCULOSKELETAL:  The bilateral lower extremities are neurovascularly intact compartments are soft. Reflexes are normal Other extremities are atraumatic with painless ROM and  NVI.  Assessment: Left lateral compression pelvic fracture with sacral involvement  Plan: Weightbearing as tolerated mobilize with physical therapy we will image her pelvis again after mobilization to assess stability. Based on her CAT scan I think it is likely to remain stable but we will reassess after mobilization.  I think it is appropriate to start aspirin for DVT prophylaxis at this time    MURPHY, TIMOTHY D, MD Cell [phone]   05/12/2016 7:15 PM     

## 2016-05-12 NOTE — H&P (Signed)
History and Physical    Connie Knox [id number] DOB: 03/21/1961 DOA: 05/12/2016  PCP: Wyatt Haste, MD Patient coming from: Home  Chief Complaint: Fall and hip adn butt pain  HPI: Connie Knox is a 55 y.o. female with medical history significant of insomnia, depression, anxiety, GERD , HLD. Patient states she was up late last night when she heard the dog barking. She went to her stairs in order to go see what was wrong when she missed the first step and fell. States that her seizures consist of 4 short stairs leading to a landing and then an additional 12 stairs down to the main 4. Patient states that she fell all the way down the stairs. Stairs are carpeted. Patient denies any LOC, dizziness, neck stiffness, headache, nausea, vomiting, vertigo, seizure type activity. Patient states that she immediately developed left buttock and hip pain. This is been constant and has gradually worsened since onset. This pain is only relieved after receiving narcotics here in the emergency room. Patient had single episode of emesis which she said came on after feeling extreme pain.  ED Course: Multiple films obtained showing fractures as outlined below. Orthopedic surgery consult.  Review of Systems: As per HPI otherwise 10 point review of systems negative.   Ambulatory Status: No restrictions  Past Medical History  Diagnosis Date  . GERD (gastroesophageal reflux disease)   . Dyslipidemia   . Panic attack   . Pubic ramus fracture ([place]) 05/11/2016    "fell down carpeted steps"    Past Surgical History  Procedure Laterality Date  . No past surgeries       reports that she has never smoked. She has never used smokeless tobacco. She reports that she drinks alcohol. She reports that she does not use illicit drugs.  Allergies  Allergen Reactions  . Aleve [Naproxen Sodium] Palpitations    Family History  Problem Relation Age of Onset  . Cancer Maternal Grandmother   . Cancer  Maternal Grandfather   . Stroke Maternal Grandfather   . Ulcers Maternal Grandfather   . Cancer Paternal Grandmother   . Cancer Paternal Grandfather     Prior to Admission medications   Medication Sig Start Date End Date Taking? Authorizing Provider  HYDROcodone-acetaminophen (NORCO/VICODIN) 5-325 MG tablet Take 1 tablet by mouth every 6 (six) hours as needed for moderate pain.   Yes Historical Provider, MD  ibuprofen (ADVIL,MOTRIN) 200 MG tablet Take 400 mg by mouth every 6 (six) hours as needed for moderate pain.    Yes Historical Provider, MD  sertraline (ZOLOFT) 50 MG tablet Take 50 mg by mouth daily. 04/17/16  Yes Historical Provider, MD  zolpidem (AMBIEN) 10 MG tablet Take 10 mg by mouth at bedtime as needed. sleep 02/13/16  Yes Historical Provider, MD    Physical Exam: Filed Vitals:   05/12/16 1345 05/12/16 1400 05/12/16 1415 05/12/16 1504  BP: 111/63 115/62 117/59 114/63  Pulse: 78 75 86 64  Temp:    98.3 F (36.8 C)  Resp:    16  Weight:      SpO2: 94% 94% 98% 96%      Constitutional: NAD, calm, comfortable Eyes:  PERRL, lids and conjunctivae normal ENMT:  Mucous membranes are moist. Posterior pharynx clear of any exudate or lesions.  Neck:  normal, supple, no masses, no thyromegaly Respiratory:  clear to auscultation bilaterally, no wheezing, no crackles. Normal respiratory effort. No accessory muscle use.  Cardiovascular:  Regular rate and rhythm, no murmurs /  rubs / gallops. No extremity edema. 2+ pedal pulses. No carotid bruits.  Abdomen:  no tenderness, no masses palpated. No hepatosplenomegaly. Bowel sounds positive.  Musculoskeletal: 2+ lower extremity pedal pulses, large left lateral and posterior proximal thigh and buttock bruising with tenderness to palpation. No bony upper body appreciated. Range of motion was not tested during my examination. Skin:  no rashes, lesions, ulcers. No induration Neurologic:  CN 2-12 grossly intact. Sensation intact, Strength 5/5 in  all 4.  Psychiatric:  Normal judgment and insight. Alert and oriented x 3. Normal mood.    Labs on Admission: I have personally reviewed following labs and imaging studies  CBC: No results for input(s): WBC, NEUTROABS, HGB, HCT, MCV, PLT in the last 168 hours. Basic Metabolic Panel: No results for input(s): NA, K, CL, CO2, GLUCOSE, BUN, CREATININE, CALCIUM, MG, PHOS in the last 168 hours. GFR: CrCl cannot be calculated (Unknown ideal weight.). Liver Function Tests: No results for input(s): AST, ALT, ALKPHOS, BILITOT, PROT, ALBUMIN in the last 168 hours. No results for input(s): LIPASE, AMYLASE in the last 168 hours. No results for input(s): AMMONIA in the last 168 hours. Coagulation Profile: No results for input(s): INR, PROTIME in the last 168 hours. Cardiac Enzymes: No results for input(s): CKTOTAL, CKMB, CKMBINDEX, TROPONINI in the last 168 hours. BNP (last 3 results) No results for input(s): PROBNP in the last 8760 hours. HbA1C: No results for input(s): HGBA1C in the last 72 hours. CBG: No results for input(s): GLUCAP in the last 168 hours. Lipid Profile: No results for input(s): CHOL, HDL, LDLCALC, TRIG, CHOLHDL, LDLDIRECT in the last 72 hours. Thyroid Function Tests: No results for input(s): TSH, T4TOTAL, FREET4, T3FREE, THYROIDAB in the last 72 hours. Anemia Panel: No results for input(s): VITAMINB12, FOLATE, FERRITIN, TIBC, IRON, RETICCTPCT in the last 72 hours. Urine analysis: No results found for: COLORURINE, APPEARANCEUR, LABSPEC, PHURINE, GLUCOSEU, HGBUR, BILIRUBINUR, KETONESUR, PROTEINUR, UROBILINOGEN, NITRITE, LEUKOCYTESUR  Creatinine Clearance: CrCl cannot be calculated (Unknown ideal weight.).  Sepsis Labs: @LABRCNTIP (procalcitonin:4,lacticidven:4) )No results found for this or any previous visit (from the past 240 hour(s)).   Radiological Exams on Admission: Dg Lumbar Spine 2-3 Views  05/12/2016  CLINICAL DATA:  55 year old female with pain after fall.  Initial encounter. EXAM: LUMBAR SPINE - 2-3 VIEW COMPARISON:  Left hip series from today reported separately. FINDINGS: Lateral view was done with supine cross-table lateral technique. Normal lumbar segmentation. Preserved lumbar vertebral height and alignment. Relatively preserved disc spaces. Bone mineralization is within normal limits. Sacrum appears grossly intact. SI joints appear normal. Visible lower thoracic levels appear intact. Left pubic rami fractures partially re- demonstrated. IMPRESSION: 1.  No acute fracture or listhesis identified in the lumbar spine. 2. Left pubic rami fractures, see left hip series. Electronically Signed   By: Genevie Ann M.D.   On: 05/12/2016 10:15   Ct Pelvis Wo Contrast  05/12/2016  CLINICAL DATA:  Fall down stairs last night with left hip pain and limited weight-bearing. Pubic rami fractures on radiographs. EXAM: CT PELVIS WITHOUT CONTRAST TECHNIQUE: Multidetector CT imaging of the pelvis was performed following the standard protocol without intravenous contrast. COMPARISON:  Radiograph same date. FINDINGS: Urinary Tract: The visualized distal ureters appear normal. No evidence of bladder injury related to the pubic rami fractures. There is no free pelvic fluid. Bowel: No bowel wall thickening, distention or surrounding inflammation identified within the pelvis. Moderate stool throughout the colon. The appendix appears normal. Vascular/Lymphatic: No enlarged pelvic lymph nodes identified. No significant vascular findings. Reproductive:  The uterus and ovaries appear normal. No evidence of adnexal mass. Other: No free pelvic fluid. Musculoskeletal: As demonstrated on earlier radiographs, there are fractures of the left superior and inferior pubic rami. The superior pubic ramus fracture demonstrates up to 6 mm of displacement with a component extending towards the bladder. No definite bladder injury. There is a nondisplaced fracture of the left sacral ala. No right-sided pelvic  fractures identified. The acetabula, symphysis pubis and sacroiliac joints appear intact. The proximal femurs are intact. There is an os acetabuli on the left. There is mild soft tissue swelling around the fractures, but no significant hematoma. IMPRESSION: 1. Left superior and inferior pubic rami fractures as described. The superior pubic ramus fracture is displaced. 2. Nondisplaced fractures of the left sacral ala. 3. No evidence of bladder injury. Electronically Signed   By: Richardean Sale M.D.   On: 05/12/2016 12:02   Dg Hip Unilat With Pelvis 2-3 Views Left  05/12/2016  CLINICAL DATA:  55 year old female with left hip pain after fall. Initial encounter. EXAM: DG HIP (WITH OR WITHOUT PELVIS) 2-3V LEFT COMPARISON:  None. FINDINGS: Comminuted fractures of the lateral aspect of the left superior pubic ramus and more central aspect of the left inferior pubic ramus. The left acetabulum appears spared. The femoral heads remain normally located. The proximal left femur appears intact. No other acute pelvic fracture identified. Grossly intact sacral ala. Proximal right femur appears intact. Underlying bone mineralization appears normal. IMPRESSION: Comminuted fractures of the left superior and inferior pubic rami. Proximal left femur appears intact. Electronically Signed   By: Genevie Ann M.D.   On: 05/12/2016 10:13   Dg Femur Min 2 Views Left  05/12/2016  CLINICAL DATA:  55 year old female with pain after fall. Initial encounter. EXAM: LEFT FEMUR 2 VIEWS COMPARISON:  Left hip series from today reported separately. FINDINGS: Partially re- demonstrated left pubic rami fractures. The proximal left femur was included on the comparison. The left femur intertrochanteric region, left femoral shaft, and distal left femur are intact. Alignment at the left knee is preserved. No left knee joint effusion identified. IMPRESSION: 1.  No acute fracture or dislocation identified in the left femur. 2. Left pubic rami fractures, see  left hip series. Electronically Signed   By: Genevie Ann M.D.   On: 05/12/2016 10:16     Assessment/Plan Active Problems:   GERD (gastroesophageal reflux disease)   Fracture of multiple pubic rami (HCC)   Sacral fracture (HCC)   Constipation   Depression   Insomnia   Multiple fractures: Imaging showing left displaced superior and nondisplaced inferior pubic rami fractures and nondisplaced fractureS of the left sacral alla. Ortho, Dr. Percell Miller, consulted by EDP and will evaluate. Anticipate non-operative management.  - Med surge - PT/OT - pain management - CBC and BMET - NPO (diet per ortho) - If needs surgery will need EKG, CXR, Coags  Constipation: baseline constipation. Anticipate this will get worse vdue to narcotic use during admission - Senna, Miralax scheduled  Depression: - continue zoloft  Insomnia: - continue home ambien  GERD: - PPI PRN  DVT prophylaxis: SCD  Code Status: FULL  Family Communication: none  Disposition Plan: pending surgical eval and recs and improvementi n pain and ambulation  Consults called: Orthopedics, [place]  Admission status: inpatient    Zerenity Bowron J MD [facility]  If 7PM-7AM, please contact night-coverage [email or web address] Password [email or web address]  05/12/2016, 3:15 PM

## 2016-05-12 NOTE — ED Notes (Signed)
Pt taken to CT.

## 2016-05-12 NOTE — Care Management Note (Signed)
Case Management Note  Patient Details  Name: Connie Knox MRN: [id number] Date of Birth: [birth year]  Subjective/Objective:                  55 yo patient in ED after chasing her dog last night and missed a step and fell down about 15 carpeted stairs hurting left side and leg cannot bear wt on left leg and pain in hip and groin area. / Home with spouse.     Action/Plan: Follow for disposition needs. /Admit to hospital.   Expected Discharge Date:  05/15/16               Expected Discharge Plan:  Home/Self Care  In-House Referral:     Discharge planning Services  CM Consult  Post Acute Care Choice:    Choice offered to:     DME Arranged:    DME Agency:     HH Arranged:    HH Agency:     Status of Service:  In process, will continue to follow  Medicare Important Message Given:    Date Medicare IM Given:    Medicare IM give by:    Date Additional Medicare IM Given:    Additional Medicare Important Message give by:     If discussed at [place] of Stay Meetings, dates discussed:    Additional Comments:  Fuller Mandril, RN 05/12/2016, 2:18 PM

## 2016-05-13 DIAGNOSIS — S32122D Severely displaced Zone II fracture of sacrum, subsequent encounter for fracture with routine healing: Secondary | ICD-10-CM | POA: Diagnosis not present

## 2016-05-13 DIAGNOSIS — S32599A Other specified fracture of unspecified pubis, initial encounter for closed fracture: Secondary | ICD-10-CM | POA: Diagnosis not present

## 2016-05-13 DIAGNOSIS — F329 Major depressive disorder, single episode, unspecified: Secondary | ICD-10-CM

## 2016-05-13 LAB — CBC
HCT: 36.9 % (ref 36.0–46.0)
HEMOGLOBIN: 11.7 g/dL — AB (ref 12.0–15.0)
MCH: 32.1 pg (ref 26.0–34.0)
MCHC: 31.7 g/dL (ref 30.0–36.0)
MCV: 101.4 fL — ABNORMAL HIGH (ref 78.0–100.0)
PLATELETS: 237 10*3/uL (ref 150–400)
RBC: 3.64 MIL/uL — AB (ref 3.87–5.11)
RDW: 12.4 % (ref 11.5–15.5)
WBC: 8 10*3/uL (ref 4.0–10.5)

## 2016-05-13 LAB — BASIC METABOLIC PANEL
Anion gap: 9 (ref 5–15)
BUN: 13 mg/dL (ref 6–20)
CO2: 26 mmol/L (ref 22–32)
Calcium: 9 mg/dL (ref 8.9–10.3)
Chloride: 104 mmol/L (ref 101–111)
Creatinine, Ser: 0.72 mg/dL (ref 0.44–1.00)
Glucose, Bld: 87 mg/dL (ref 65–99)
POTASSIUM: 4 mmol/L (ref 3.5–5.1)
SODIUM: 139 mmol/L (ref 135–145)

## 2016-05-13 MED ORDER — HYDROMORPHONE HCL 1 MG/ML IJ SOLN: 0.5000 mg | INTRAMUSCULAR | Status: DC | PRN

## 2016-05-13 MED ORDER — OXYCODONE HCL 5 MG PO TABS
5.0000 mg | ORAL_TABLET | ORAL | Status: DC
  Administered 2016-05-13 – 2016-05-15 (×10): 5 mg via ORAL
  Filled 2016-05-13 (×10): qty 1

## 2016-05-13 NOTE — Progress Notes (Signed)
PROGRESS NOTE    Connie Knox  [id number] DOB: November 10, 1961 DOA: 05/12/2016 PCP: Wyatt Haste, MD  Outpatient Specialists:    Brief Narrative:  Active 55 year old female Accidental fall downstairs after chasing dog Significant pain and inability to mobilize Brought to emergency room found to have sacral fractures Orthopedics consulted    Assessment & Plan:   Active Problems:   GERD (gastroesophageal reflux disease)   Fracture of multiple pubic rami (HCC)   Sacral fracture (HCC)   Constipation   Depression   Insomnia   Closed fracture of sacrum ([place])   Fall   Patient awake alert doing well Has mobilize with therapy who recommend rolling walker with 5 we'll see We will attempt to cut back on IV pain medication today and mobilize one more time in a.m. and if she is stable we can probably discharge her home with close outpatient follow-up and repeat x-rays She is 55 is a smoker and does drink occasionally so she is at slightly higher risk given her race been Caucasian for osteoporosis I would recommend 50,000 units of vitamin D in addition to Os-Cal supplementation 3 times a day and a vitamin D3 level as an outpatient on discharge   Lovenox Full code No family present Likely discharge home 05/14/2016 if all okay  Consultants:   Dr. Fred[facility] orthopedics  Procedures:   CT scan of the back  Antimicrobials:   None currently    Subjective: Doing fair Some discomfort on ambulation today with therapy however was able to get to the bed and chair in the room with minimal assistance States pain is 6/10 No radiation No fever No chills Problems.  Objective: Filed Vitals:   05/12/16 1800 05/12/16 1803 05/12/16 2147 05/13/16 0625  BP:   118/58 105/58  Pulse:   68 73  Temp:   98.6 F (37 C) 98.3 F (36.8 C)  TempSrc:   Oral Oral  Resp:   15 16  Height: 5\' 6"  (1.676 m) 5\' 6"  (1.676 m)    Weight: 71.668 kg (158 lb) 71.668 kg (158 lb)    SpO2:    92% 98%    Intake/Output Summary (Last 24 hours) at 05/13/16 1358 Last data filed at 05/13/16 1300  Gross per 24 hour  Intake    480 ml  Output    500 ml  Net    -20 ml   Filed Weights   05/12/16 0858 05/12/16 1800 05/12/16 1803  Weight: 71.668 kg (158 lb) 71.668 kg (158 lb) 71.668 kg (158 lb)    Examination:  Alert pleasant oriented no apparent distress No icterus no pallor Trachea central Chest clinically clear no added sound S1-S2 no murmur rub or gallop No LE edema No rash   Data Reviewed: I have personally reviewed following labs and imaging studies  CBC:  Recent Labs Lab 05/13/16 0638  WBC 8.0  HGB 11.7*  HCT 36.9  MCV 101.4*  PLT 123XX123   Basic Metabolic Panel:  Recent Labs Lab 05/13/16 0638  NA 139  K 4.0  CL 104  CO2 26  GLUCOSE 87  BUN 13  CREATININE 0.72  CALCIUM 9.0   GFR: Estimated Creatinine Clearance: 81.6 mL/min (by C-G formula based on Cr of 0.72). Liver Function Tests: No results for input(s): AST, ALT, ALKPHOS, BILITOT, PROT, ALBUMIN in the last 168 hours. No results for input(s): LIPASE, AMYLASE in the last 168 hours. No results for input(s): AMMONIA in the last 168 hours. Coagulation Profile: No results for  input(s): INR, PROTIME in the last 168 hours. Cardiac Enzymes: No results for input(s): CKTOTAL, CKMB, CKMBINDEX, TROPONINI in the last 168 hours. BNP (last 3 results) No results for input(s): PROBNP in the last 8760 hours. HbA1C: No results for input(s): HGBA1C in the last 72 hours. CBG: No results for input(s): GLUCAP in the last 168 hours. Lipid Profile: No results for input(s): CHOL, HDL, LDLCALC, TRIG, CHOLHDL, LDLDIRECT in the last 72 hours. Thyroid Function Tests: No results for input(s): TSH, T4TOTAL, FREET4, T3FREE, THYROIDAB in the last 72 hours. Anemia Panel: No results for input(s): VITAMINB12, FOLATE, FERRITIN, TIBC, IRON, RETICCTPCT in the last 72 hours. Urine analysis: No results found for: COLORURINE,  APPEARANCEUR, LABSPEC, PHURINE, GLUCOSEU, HGBUR, BILIRUBINUR, KETONESUR, PROTEINUR, UROBILINOGEN, NITRITE, LEUKOCYTESUR Sepsis Labs: @LABRCNTIP (procalcitonin:4,lacticidven:4)  )No results found for this or any previous visit (from the past 240 hour(s)).       Radiology Studies: Dg Lumbar Spine 2-3 Views  05/12/2016  CLINICAL DATA:  55 year old female with pain after fall. Initial encounter. EXAM: LUMBAR SPINE - 2-3 VIEW COMPARISON:  Left hip series from today reported separately. FINDINGS: Lateral view was done with supine cross-table lateral technique. Normal lumbar segmentation. Preserved lumbar vertebral height and alignment. Relatively preserved disc spaces. Bone mineralization is within normal limits. Sacrum appears grossly intact. SI joints appear normal. Visible lower thoracic levels appear intact. Left pubic rami fractures partially re- demonstrated. IMPRESSION: 1.  No acute fracture or listhesis identified in the lumbar spine. 2. Left pubic rami fractures, see left hip series. Electronically Signed   By: Genevie Ann M.D.   On: 05/12/2016 10:15   Ct Pelvis Wo Contrast  05/12/2016  CLINICAL DATA:  Fall down stairs last night with left hip pain and limited weight-bearing. Pubic rami fractures on radiographs. EXAM: CT PELVIS WITHOUT CONTRAST TECHNIQUE: Multidetector CT imaging of the pelvis was performed following the standard protocol without intravenous contrast. COMPARISON:  Radiograph same date. FINDINGS: Urinary Tract: The visualized distal ureters appear normal. No evidence of bladder injury related to the pubic rami fractures. There is no free pelvic fluid. Bowel: No bowel wall thickening, distention or surrounding inflammation identified within the pelvis. Moderate stool throughout the colon. The appendix appears normal. Vascular/Lymphatic: No enlarged pelvic lymph nodes identified. No significant vascular findings. Reproductive: The uterus and ovaries appear normal. No evidence of adnexal  mass. Other: No free pelvic fluid. Musculoskeletal: As demonstrated on earlier radiographs, there are fractures of the left superior and inferior pubic rami. The superior pubic ramus fracture demonstrates up to 6 mm of displacement with a component extending towards the bladder. No definite bladder injury. There is a nondisplaced fracture of the left sacral ala. No right-sided pelvic fractures identified. The acetabula, symphysis pubis and sacroiliac joints appear intact. The proximal femurs are intact. There is an os acetabuli on the left. There is mild soft tissue swelling around the fractures, but no significant hematoma. IMPRESSION: 1. Left superior and inferior pubic rami fractures as described. The superior pubic ramus fracture is displaced. 2. Nondisplaced fractures of the left sacral ala. 3. No evidence of bladder injury. Electronically Signed   By: Richardean Sale M.D.   On: 05/12/2016 12:02   Dg Hip Unilat With Pelvis 2-3 Views Left  05/12/2016  CLINICAL DATA:  55 year old female with left hip pain after fall. Initial encounter. EXAM: DG HIP (WITH OR WITHOUT PELVIS) 2-3V LEFT COMPARISON:  None. FINDINGS: Comminuted fractures of the lateral aspect of the left superior pubic ramus and more central aspect of the  left inferior pubic ramus. The left acetabulum appears spared. The femoral heads remain normally located. The proximal left femur appears intact. No other acute pelvic fracture identified. Grossly intact sacral ala. Proximal right femur appears intact. Underlying bone mineralization appears normal. IMPRESSION: Comminuted fractures of the left superior and inferior pubic rami. Proximal left femur appears intact. Electronically Signed   By: Genevie Ann M.D.   On: 05/12/2016 10:13   Dg Femur Min 2 Views Left  05/12/2016  CLINICAL DATA:  55 year old female with pain after fall. Initial encounter. EXAM: LEFT FEMUR 2 VIEWS COMPARISON:  Left hip series from today reported separately. FINDINGS: Partially  re- demonstrated left pubic rami fractures. The proximal left femur was included on the comparison. The left femur intertrochanteric region, left femoral shaft, and distal left femur are intact. Alignment at the left knee is preserved. No left knee joint effusion identified. IMPRESSION: 1.  No acute fracture or dislocation identified in the left femur. 2. Left pubic rami fractures, see left hip series. Electronically Signed   By: Genevie Ann M.D.   On: 05/12/2016 10:16        Scheduled Meds: . aspirin  324 mg Oral Daily  . polyethylene glycol  17 g Oral BID  . senna  1 tablet Oral BID  . sertraline  50 mg Oral Daily  . sodium chloride flush  3 mL Intravenous Q12H   Continuous Infusions:    LOS: 1 day    Time spent: [place], MD [facility] ([facility]   If 7PM-7AM, please contact night-coverage [email or web address] Password [email or web address] 05/13/2016, 1:58 PM

## 2016-05-13 NOTE — Evaluation (Signed)
Physical Therapy Evaluation Patient Details Name: Connie Knox MRN: [id number] DOB: 1961/09/27 Today's Date: 05/13/2016   History of Present Illness  55 yo s/p fall downstairs with resulting Left lateral compression pelvic fracture with sacral involvement. WBAT. PMH: panic attacks  Clinical Impression  Pt seen for initial evaluation and treatment. Current mobility is very guarded and slow, requiring +2 assistance for bed mobility and stand-pivot transfers. Encouragement needed throughout session to progress activity as pt becoming anxious. Pain and nausea are primary activity limitations currently.  PT to continue to follow and advance mobility in anticipation of D/C to home with family support.     Follow Up Recommendations Home health PT;Supervision for mobility/OOB    Equipment Recommendations  Rolling walker with 5" wheels (further recommendations may be needed as mobility progresses).    Recommendations for Other Services       Precautions / Restrictions Precautions Precautions: Fall Restrictions Weight Bearing Restrictions: Yes RLE Weight Bearing: Weight bearing as tolerated LLE Weight Bearing: Weight bearing as tolerated      Mobility  Bed Mobility Overal bed mobility: +2 for physical assistance;Needs Assistance Bed Mobility: Supine to Sit     Supine to sit: Min assist;+2 for physical assistance     General bed mobility comments: Assist provided with LLE and trunk. Very slow and guarded mobility.   Transfers Overall transfer level: Needs assistance Equipment used: Rolling walker (2 wheeled) Transfers: Sit to/from Stand Sit to Stand: Min assist;+2 safety/equipment Stand pivot transfers: Min assist;+2 safety/equipment       General transfer comment: transfers performed from bed to [facility] to chair. All mobility remains guarded.   Ambulation/Gait                Stairs            Wheelchair Mobility    Modified Rankin (Stroke Patients Only)        Balance Overall balance assessment: Needs assistance Sitting-balance support: No upper extremity supported Sitting balance-Leahy Scale: Fair     Standing balance support: Bilateral upper extremity supported Standing balance-Leahy Scale: Poor Standing balance comment: needing UE support with rw                             Pertinent Vitals/Pain Pain Assessment: 0-10 Pain Score: 9  Pain Location: pelvis, Lt worse than Rt Pain Descriptors / Indicators: Aching;Sharp Pain Intervention(s): Limited activity within patient's tolerance;Monitored during session;Premedicated before session    Home Living Family/patient expects to be discharged to:: Private residence Living Arrangements: Spouse/significant other Available Help at Discharge: Family;Available PRN/intermittently Type of Home: House Home Access: Stairs to enter Entrance Stairs-Rails: None Entrance Stairs-Number of Steps: 3 Home Layout: Bed/bath upstairs Home Equipment: None Additional Comments: Pt reports that her husband is usually around    Prior Function Level of Independence: Independent               Hand Dominance       Extremity/Trunk Assessment   Upper Extremity Assessment: Defer to OT evaluation           Lower Extremity Assessment: LLE deficits/detail   LLE Deficits / Details: stregth inhibited by pain.  Minimal weightbearing with mobility  Cervical / Trunk Assessment: Normal  Communication   Communication: No difficulties  Cognition Arousal/Alertness: Awake/alert Behavior During Therapy: Anxious Overall Cognitive Status: Within Functional Limits for tasks assessed  General Comments      Exercises        Assessment/Plan    PT Assessment Patient needs continued PT services  PT Diagnosis Difficulty walking;Acute pain   PT Problem List Decreased strength;Decreased range of motion;Decreased activity tolerance;Decreased balance;Decreased  mobility  PT Treatment Interventions DME instruction;Gait training;Stair training;Functional mobility training;Therapeutic activities;Therapeutic exercise;Patient/family education   PT Goals (Current goals can be found in the Care Plan section) Acute Rehab PT Goals Patient Stated Goal: be able to move around again and have less pain PT Goal Formulation: With patient Time For Goal Achievement: 05/27/16 Potential to Achieve Goals: Good    Frequency Min 5X/week   Barriers to discharge        Co-evaluation   Reason for Co-Treatment: Complexity of the patient's impairments (multi-system involvement);For patient/therapist safety PT goals addressed during session: Mobility/safety with mobility OT goals addressed during session: ADL's and self-care       End of Session Equipment Utilized During Treatment: Gait belt Activity Tolerance: Patient limited by pain Patient left: in chair;with call bell/phone within reach Nurse Communication: Mobility status         Time: [id number] PT Time Calculation (min) (ACUTE ONLY): 45 min   Charges:   PT Evaluation $PT Eval Moderate Complexity: 1 Procedure PT Treatments $Therapeutic Activity: 8-22 mins   PT G Codes:        Cassell Clement, PT, CSCS Pager [phone] Office [phone]  05/13/2016, 11:31 AM

## 2016-05-13 NOTE — Progress Notes (Signed)
Occupational Therapy Evaluation Patient Details Name: Connie Knox MRN: [id number] DOB: [birth year] Today's Date: 05/13/2016    History of Present Illness 55 yo s/p fall downstairs with resulting Left lateral compression pelvic fracture with sacral involvement. WBAT   Clinical Impression   PTA, pt independent with ADL and mobility. Pt limited by pain this am, but able to take a few "shuffle steps" to [facility] then to chair. Anxious about mobility. Became nauseated during session. Will follow acutely to address mobility and ADL to facilitate safe D/C home.     Follow Up Recommendations  Home health OT;Supervision - Intermittent    Equipment Recommendations  3 in 1 bedside comode    Recommendations for Other Services       Precautions / Restrictions Precautions Precautions: Fall Restrictions Weight Bearing Restrictions: No      Mobility Bed Mobility Overal bed mobility: +2 for physical assistance;Needs Assistance Bed Mobility: Supine to Sit     Supine to sit: Min assist;+2 for physical assistance     General bed mobility comments: Very slow. Use of rails. Comes into long sitting then pivots to EOB  Transfers Overall transfer level: Needs assistance Equipment used: Rolling walker (2 wheeled) Transfers: Sit to/from [facility] Sit to Stand: Min assist;+2 safety/equipment Stand pivot transfers: Min assist;+2 safety/equipment            Balance Overall balance assessment: Needs assistance           Standing balance-Leahy Scale: Fair                              ADL Overall ADL's : Needs assistance/impaired     Grooming: Set up   Upper Body Bathing: Set up   Lower Body Bathing: Moderate assistance;Sit to/from stand   Upper Body Dressing : Set up   Lower Body Dressing: Moderate assistance;Sit to/from stand   Toilet Transfer: Minimal assistance;RW;BSC;+2 for safety/equipment   Toileting- Clothing Manipulation and  Hygiene: Minimal assistance;Sit to/from stand       Functional mobility during ADLs: Minimal assistance;+2 for safety/equipment;Rolling walker General ADL Comments: Pt very anxious about moving. Requires increased time to move     Vision     Perception     Praxis      Pertinent Vitals/Pain Pain Assessment: 0-10 Pain Score: 9  Pain Location: L hip with movement Pain Descriptors / Indicators: Aching;Grimacing;Guarding;Sharp Pain Intervention(s): Limited activity within patient's tolerance     Hand Dominance Right   Extremity/Trunk Assessment Upper Extremity Assessment Upper Extremity Assessment: Overall WFL for tasks assessed   Lower Extremity Assessment Lower Extremity Assessment: Defer to PT evaluation   Cervical / Trunk Assessment Cervical / Trunk Assessment: Normal   Communication     Cognition Arousal/Alertness: Awake/alert Behavior During Therapy: Anxious Overall Cognitive Status: Within Functional Limits for tasks assessed                     General Comments       Exercises       Shoulder Instructions      Home Living Family/patient expects to be discharged to:: Private residence Living Arrangements: Spouse/significant other Available Help at Discharge: Family;Available PRN/intermittently Type of Home: House Home Access: Stairs to enter     Home Layout: Bed/bath upstairs     Bathroom Shower/Tub: Occupational psychologist: Standard Bathroom Accessibility: Yes How Accessible: Accessible via walker Home Equipment: Shower seat - built in  Prior Functioning/Environment Level of Independence: Independent             OT Diagnosis: Generalized weakness;Acute pain   OT Problem List: Decreased strength;Decreased range of motion;Decreased activity tolerance;Impaired balance (sitting and/or standing);Decreased safety awareness;Decreased knowledge of use of DME or AE;Decreased knowledge of precautions;Pain   OT  Treatment/Interventions: Self-care/ADL training;DME and/or AE instruction;Therapeutic activities;Patient/family education    OT Goals(Current goals can be found in the care plan section) Acute Rehab OT Goals Patient Stated Goal: for pain to be better OT Goal Formulation: With patient Time For Goal Achievement: 05/27/16 Potential to Achieve Goals: Good ADL Goals Pt Will Perform Lower Body Bathing: with min guard assist;with adaptive equipment;sit to/from stand;with caregiver independent in assisting Pt Will Perform Lower Body Dressing: with caregiver independent in assisting;sit to/from stand;with adaptive equipment;with min guard assist Pt Will Transfer to Toilet: with min guard assist;bedside commode (caregiver independent in assisting) Pt Will Perform Toileting - Clothing Manipulation and hygiene: with set-up;sit to/from stand Pt Will Perform Tub/Shower Transfer: ambulating;3 in 1;with min guard assist;rolling walker;with caregiver independent in assisting  OT Frequency: Min 2X/week   Barriers to D/C:    multiple stairs in house       Co-evaluation PT/OT/SLP Co-Evaluation/Treatment: Yes Reason for Co-Treatment: For patient/therapist safety;Other (comment) (due to pain)   OT goals addressed during session: ADL's and self-care      End of Session Equipment Utilized During Treatment: Gait belt;Rolling walker Nurse Communication: Mobility status;Other (comment);Precautions;Weight bearing status (request for nausea meds)  Activity Tolerance: Patient limited by pain Patient left: in chair;with call bell/phone within reach   Time: [id number] OT Time Calculation (min): 42 min Charges:  OT General Charges $OT Visit: 1 Procedure OT Evaluation $OT Eval Moderate Complexity: 1 Procedure G-Codes:    [name],[name] 05-25-2016, 11:05 AM   Maurie Boettcher, OTR/L  [phone] 05/25/2016

## 2016-05-14 DIAGNOSIS — S32122D Severely displaced Zone II fracture of sacrum, subsequent encounter for fracture with routine healing: Secondary | ICD-10-CM | POA: Diagnosis not present

## 2016-05-14 NOTE — NC FL2 (Signed)
[place] LEVEL OF CARE SCREENING TOOL     IDENTIFICATION  Patient Name: NALANIE SLOMA Birthdate: Mar 06, 1961 Sex: female Admission Date (Current Location): 05/12/2016  [facility] and [place] Number:  Herbalist and Address:  [facility] [place], [place] [address] [place], [place] [address]      Provider Number: [id number]  Attending Physician Name and Address:  Nita Sells, MD  Relative Name and Phone Number:       Current Level of Care: Hospital Recommended Level of Care: [place] Prior Approval Number:    Date Approved/Denied:   PASRR Number: [id number]  Discharge Plan: SNF    Current Diagnoses: Patient Active Problem List   Diagnosis Date Noted  . Fracture of multiple pubic rami ([place]) 05/12/2016  . Sacral fracture ([place]) 05/12/2016  . Constipation 05/12/2016  . Depression 05/12/2016  . Insomnia 05/12/2016  . Closed fracture of sacrum ([place])   . Fall   . GERD (gastroesophageal reflux disease) 06/11/2011  . Dyslipidemia 06/11/2011    Orientation RESPIRATION BLADDER Height & Weight     Self, Time, Situation, Place  Normal Continent Weight: 158 lb (71.668 kg) Height:  5\' 6"  (167.6 cm)  BEHAVIORAL SYMPTOMS/MOOD NEUROLOGICAL BOWEL NUTRITION STATUS      Continent Diet  AMBULATORY STATUS COMMUNICATION OF NEEDS Skin   Limited Assist Verbally Normal                       Personal Care Assistance Level of Assistance  Bathing, Dressing Bathing Assistance: Limited assistance   Dressing Assistance: Limited assistance     Functional Limitations Info             SPECIAL CARE FACTORS FREQUENCY  PT (By licensed PT), OT (By licensed OT)     PT Frequency: 5/wk OT Frequency: 5/wk            Contractures      Additional Factors Info  Code Status, Allergies, Psychotropic Code Status Info: FULL Allergies Info: Aleve Psychotropic Info: zoloft         Current Medications (05/14/2016):   This is the current hospital active medication list Current Facility-Administered Medications  Medication Dose Route Frequency Provider Last Rate Last Dose  . 0.9 %  sodium chloride infusion  250 mL Intravenous PRN Waldemar Dickens, MD      . acetaminophen (TYLENOL) tablet 650 mg  650 mg Oral Q6H PRN Waldemar Dickens, MD       Or  . acetaminophen (TYLENOL) suppository 650 mg  650 mg Rectal Q6H PRN Waldemar Dickens, MD      . aspirin chewable tablet 324 mg  324 mg Oral Daily Renette Butters, MD   324 mg at 05/14/16 0834  . HYDROmorphone (DILAUDID) injection 0.5 mg  0.5 mg Intravenous Q4H PRN Nita Sells, MD      . ondansetron (ZOFRAN) tablet 4 mg  4 mg Oral Q6H PRN Waldemar Dickens, MD       Or  . ondansetron [facility]) injection 4 mg  4 mg Intravenous Q6H PRN Waldemar Dickens, MD   4 mg at 05/13/16 1038  . oxyCODONE (Oxy IR/ROXICODONE) immediate release tablet 5 mg  5 mg Oral Q4H Nita Sells, MD   5 mg at 05/14/16 1640  . polyethylene glycol (MIRALAX / GLYCOLAX) packet 17 g  17 g Oral BID Waldemar Dickens, MD   17 g at 05/14/16 [id number]  . senna (SENOKOT)  tablet 8.6 mg  1 tablet Oral BID Waldemar Dickens, MD   8.6 mg at 05/14/16 [id number]  . sertraline (ZOLOFT) tablet 50 mg  50 mg Oral Daily Waldemar Dickens, MD   50 mg at 05/13/16 2059  . sodium chloride flush (NS) 0.9 % injection 3 mL  3 mL Intravenous Q12H Waldemar Dickens, MD   3 mL at 05/14/16 1000  . sodium chloride flush (NS) 0.9 % injection 3 mL  3 mL Intravenous PRN Waldemar Dickens, MD      . zolpidem [facility]) tablet 5 mg  5 mg Oral QHS PRN Waldemar Dickens, MD         Discharge Medications: Please see discharge summary for a list of discharge medications.  Relevant Imaging Results:  Relevant Lab Results:   Additional Information SS#: [id number]  Cranford Mon, [place]

## 2016-05-14 NOTE — Clinical Social Work Note (Signed)
Clinical Social Work Assessment  Patient Details  Name: Connie Knox MRN: [id number] Date of Birth: 10-29-1961  Date of referral:  05/14/16               Reason for consult:  Facility Placement                Permission sought to share information with:    Permission granted to share information::  Yes, Verbal Permission Granted  Name::        Agency::  [facility]  Relationship::  spouse  Contact Information:     Housing/Transportation Living arrangements for the past 2 months:  Single Family Home Source of Information:  Patient Patient Interpreter Needed:  None Criminal Activity/Legal Involvement Pertinent to Current Situation/Hospitalization:  No - Comment as needed Significant Relationships:  Spouse Lives with:  Spouse Do you feel safe going back to the place where you live?  No Need for family participation in patient care:  No (Coment)  Care giving concerns:  Pt will need 24/hr assistance because cannot manage stairs at this time- spouse works in 12 hour shifts and pt would have no other support to help at home.   Social Worker assessment / plan:  CSW consulted for last minute change to SNF because pt is reticent about returning home without being able to go up stairs.  Employment status:  Retired Surveyor, [employer] Care PT Recommendations:  Home with [facility] / Referral to community resources:  [place]  Patient/Family's Response to care:  Pt is agreeable to SNF  Patient/Family's Understanding of and Emotional Response to Diagnosis, Current Treatment, and Prognosis:  No questions or concerns hopeful she will recover quickly.  Emotional Assessment Appearance:  Appears stated age Attitude/Demeanor/Rapport:    Affect (typically observed):  Appropriate, Accepting Orientation:  Oriented to Situation, Oriented to  Time, Oriented to Place, Oriented to Self Alcohol / Substance use:  Not Applicable Psych involvement (Current and  /or in the community):  No (Comment)  Discharge Needs  Concerns to be addressed:  Home Safety Concerns, Care Coordination Readmission within the last 30 days:  No Current discharge risk:  Physical Impairment Barriers to Discharge:  Continued Medical Work up   [employer], LCSW 05/14/2016, 5:13 PM

## 2016-05-14 NOTE — Care Management Note (Signed)
Case Management Note  Patient Details  Name: Connie Knox MRN: [id number] Date of Birth: [birth year]  Subjective/Objective:           admitted with multiple pubic rami fractures        Action/Plan: Spoke with patient about discharge plan, she was given choice for home health and selected [facility]. Contacted [facility] at [facility] and set up [place], [place] and aide visits. Patient stated on 05/13/16 that her husband would be able to assist her after discharge, now stating that she will not have assistance at home. Offered private duty care list but patient declined.Rolling walker and 3N1 have been delivered to patient's room. Explained that her progress with therapy will continue to be monitored for safe level of care for discharge.    Expected Discharge Date:  05/15/16               Expected Discharge Plan:  [place]  In-House Referral:     Discharge planning Services  CM Consult  Post Acute Care Choice:  Durable Medical Equipment, Home Health Choice offered to:  Patient  DME Arranged:  3-N-1, Walker rolling DME Agency:  [facility]  HH Arranged:  PT, OT, Nurse's Aide [place] Agency:  [facility]  Status of Service:  In process, will continue to follow  Medicare Important Message Given:    Date Medicare IM Given:    Medicare IM give by:    Date Additional Medicare IM Given:    Additional Medicare Important Message give by:     If discussed at [place] of Stay Meetings, dates discussed:    Additional Comments:  Nila Nephew, RN 05/14/2016, 2:59 PM

## 2016-05-14 NOTE — Progress Notes (Signed)
Physical Therapy Treatment Patient Details Name: Connie Knox MRN: [id number] DOB: 12-16-1961 Today's Date: 05/14/2016    History of Present Illness 55 yo s/p fall downstairs with resulting Left lateral compression pelvic fracture with sacral involvement. WBAT. PMH: panic attacks    PT Comments    Pt reporting that her husband is not going to be able to provide assistance at home as initially described. Currently she states that she will need to be on her own throughout the day. Discussion had with pt on options for equipment and supervision/assistance at home. Pt to talk with her husband today to discuss options. The patient did progress with her mobility today, ambulating 25 feet with rw. Pt continues to need assistance with bed mobility and is min guard with transfers. Based upon the patient's current mobility, continue to anticipate D/C to home with intromittent assistance.    Follow Up Recommendations  Home health PT;Supervision - Intermittent     Equipment Recommendations  Rolling walker with 5" wheels    Recommendations for Other Services       Precautions / Restrictions Precautions Precautions: Fall Restrictions Weight Bearing Restrictions: Yes RLE Weight Bearing: Weight bearing as tolerated LLE Weight Bearing: Weight bearing as tolerated    Mobility  Bed Mobility Overal bed mobility: Needs Assistance Bed Mobility: Supine to Sit     Supine to sit: Min assist     General bed mobility comments: Min assist with LLE to get to EOB, using rail to assist.   Transfers Overall transfer level: Needs assistance Equipment used: Rolling walker (2 wheeled) Transfers: Sit to/from Stand Sit to Stand: Min guard Stand pivot transfers: Min guard (bed to [facility])       General transfer comment: cues for hand position.   Ambulation/Gait Ambulation/Gait assistance: Min guard Ambulation Distance (Feet): 25 Feet Assistive device: Rolling walker (2 wheeled) Gait  Pattern/deviations: Step-to pattern;Decreased weight shift to left Gait velocity: very slow   General Gait Details: Encouraging gradual progression of weightbearing through LLE as ambulation progressed.    Stairs            Wheelchair Mobility    Modified Rankin (Stroke Patients Only)       Balance Overall balance assessment: Needs assistance Sitting-balance support: No upper extremity supported Sitting balance-Leahy Scale: Good     Standing balance support: Bilateral upper extremity supported Standing balance-Leahy Scale: Poor Standing balance comment: using rw                    Cognition Arousal/Alertness: Awake/alert Behavior During Therapy: Anxious Overall Cognitive Status: Within Functional Limits for tasks assessed                      Exercises      General Comments        Pertinent Vitals/Pain Pain Assessment: 0-10 Pain Score: 4  Pain Location: LLE/pelvis Pain Descriptors / Indicators: Aching Pain Intervention(s): Limited activity within patient's tolerance;Monitored during session    Home Living                      Prior Function            PT Goals (current goals can now be found in the care plan section) Acute Rehab PT Goals Patient Stated Goal: move around and get back to work.  PT Goal Formulation: With patient Time For Goal Achievement: 05/27/16 Potential to Achieve Goals: Good Progress towards PT goals: Progressing toward goals  Frequency  Min 5X/week    PT Plan Current plan remains appropriate    Co-evaluation             End of Session Equipment Utilized During Treatment: Gait belt Activity Tolerance: Patient limited by pain Patient left: in chair;with call bell/phone within reach     Time: 1011-1105 PT Time Calculation (min) (ACUTE ONLY): 54 min  Charges:  $Gait Training: 8-22 mins $Therapeutic Activity: 38-52 mins                    G Codes:      Cassell Clement, PT,  CSCS Pager [phone] Office [phone]  05/14/2016, 1:24 PM

## 2016-05-14 NOTE — Progress Notes (Signed)
Occupational Therapy Treatment Patient Details Name: Connie Knox MRN: [id number] DOB: 24-Feb-1961 Today's Date: 05/14/2016    History of present illness 55 yo s/p fall downstairs with resulting Left lateral compression pelvic fracture with sacral involvement. WBAT. PMH: panic attacks   OT comments  Pt progressing with mobility and ADL. Began educating pt on use of AE for LB ADL. Pt discussing her concern about D/C home with limited help from husband ( gone for 16 hrs?). Discussed options of SNF or home with HH therapies, possible at w/c level and use of private caregiving service as needed. Pt may need SNF for rehab to facilitate safe D/C home. Will continue to follow acutely to address established goals and facilitate D/C to next venue of care.   Follow Up Recommendations  Outpatient OT;Supervision - Intermittent    Equipment Recommendations  3 in 1 bedside comode    Recommendations for Other Services      Precautions / Restrictions Precautions Precautions: Fall Restrictions RLE Weight Bearing: Weight bearing as tolerated LLE Weight Bearing: Weight bearing as tolerated       Mobility Bed Mobility Overal bed mobility: Needs Assistance Bed Mobility: Supine to Sit     Supine to sit: Supervision;HOB elevated (15 min to get to EOB)        Transfers                      Balance                                   ADL                                       Functional mobility during ADLs: Minimal assistance General ADL Comments: began educating pt on use of AE for ADL. took pt greater than 15 min to move to EOB with HOB elevated @ 30 degrees and use of handrails. Demonstarted use of AE for LB ADL. Attempted to have pt practice, but pt took work phone call for @ 10 min and then decided to continue session tomorrow.       Vision                     Perception     Praxis      Cognition   Behavior During Therapy:  Anxious Overall Cognitive Status: Within Functional Limits for tasks assessed                       Extremity/Trunk Assessment               Exercises     Shoulder Instructions       General Comments      Pertinent Vitals/ Pain       Pain Assessment: 0-10 Pain Score: 5  Pain Location: LLE pelvis Pain Descriptors / Indicators: Aching;Guarding;Grimacing;Discomfort;Shooting;Sharp Pain Intervention(s): Limited activity within patient's tolerance  Home Living                                          Prior Functioning/Environment              Frequency Min 2X/week     Progress Toward  Goals  OT Goals(current goals can now be found in the care plan section)  Progress towards OT goals: Progressing toward goals  Acute Rehab OT Goals Patient Stated Goal: move around and get back to work.  OT Goal Formulation: With patient Time For Goal Achievement: 05/27/16 Potential to Achieve Goals: Good ADL Goals Pt Will Perform Lower Body Bathing: with min guard assist;with adaptive equipment;sit to/from stand;with caregiver independent in assisting Pt Will Perform Lower Body Dressing: with caregiver independent in assisting;sit to/from stand;with adaptive equipment;with min guard assist Pt Will Transfer to Toilet: with min guard assist;bedside commode Pt Will Perform Toileting - Clothing Manipulation and hygiene: with set-up;sit to/from stand Pt Will Perform Tub/Shower Transfer: ambulating;3 in 1;with min guard assist;rolling walker;with caregiver independent in assisting  Plan Discharge plan remains appropriate    Co-evaluation                 End of Session     Activity Tolerance Patient tolerated treatment well   Patient Left in bed;with call bell/phone within reach   Nurse Communication Mobility status        Time: 1645-1710 OT Time Calculation (min): 25 min  Charges: OT General Charges $OT Visit: 1 Procedure OT  Treatments $Self Care/Home Management : 23-37 mins  Kion Huntsberry,[name] 05/14/2016, 5:46 PM   [facility], OTR/L  [phone] 05/14/2016

## 2016-05-14 NOTE — Progress Notes (Signed)
PROGRESS NOTE    Connie Knox  [id number] DOB: 10-Dec-1961 DOA: 05/12/2016 PCP: Wyatt Haste, MD  Outpatient Specialists:    Brief Narrative:  Active 55 year old female Accidental fall downstairs after chasing dog Significant pain and inability to mobilize Brought to emergency room found to have sacral fractures Orthopedics consulted    Assessment & Plan:   Active Problems:   GERD (gastroesophageal reflux disease)   Fracture of multiple pubic rami (HCC)   Sacral fracture (HCC)   Constipation   Depression   Insomnia   Closed fracture of sacrum (HCC)   Fall   Pubic #'s -non op -mobilizing ok with therapy -Patient awake alert doing well -Has mobilize with therapy who recommend rolling walker with 5  -continue pain management today and ambulate again and if stable can probably d/c home -husband lives at home but works 12 hour days, so attempting to arrange home care as per PT/OT  She is 67 is a smoker and does drink occasionally so she is at slightly higher risk given her race been Caucasian for osteoporosis I would recommend 50,000 units of vitamin D in addition to Os-Cal supplementation 3 times a day and a vitamin D3 level as an outpatient on discharge   Lovenox Full code No family present Likely discharge home 05/14/2016 if all okay  Consultants:   Dr. Fredonia Highland orthopedics  Procedures:   CT scan of the back  Antimicrobials:   None currently    Subjective:  slightly anxious No cp No n/v Pain ~ 6/10 Objective: Filed Vitals:   05/13/16 0625 05/13/16 1500 05/13/16 1957 05/14/16 0636  BP: 105/58 115/72 109/62 101/61  Pulse: 73 68 69 69  Temp: 98.3 F (36.8 C) 98 F (36.7 C) 97.7 F (36.5 C) 98.2 F (36.8 C)  TempSrc: Oral Oral Oral Oral  Resp: 16 16 16 16   Height:      Weight:      SpO2: 98% 96% 100% 97%    Intake/Output Summary (Last 24 hours) at 05/14/16 1137 Last data filed at 05/14/16 0900  Gross per 24 hour  Intake     480 ml  Output      0 ml  Net    480 ml   Filed Weights   05/12/16 0858 05/12/16 1800 05/12/16 1803  Weight: 71.668 kg (158 lb) 71.668 kg (158 lb) 71.668 kg (158 lb)    Examination:  Alert pleasant oriented no apparent distress No icterus no pallor Trachea central Chest clinically clear no added sound S1-S2 no murmur rub or gallop No LE edema    Data Reviewed: I have personally reviewed following labs and imaging studies  CBC:  Recent Labs Lab 05/13/16 0638  WBC 8.0  HGB 11.7*  HCT 36.9  MCV 101.4*  PLT 123XX123   Basic Metabolic Panel:  Recent Labs Lab 05/13/16 0638  NA 139  K 4.0  CL 104  CO2 26  GLUCOSE 87  BUN 13  CREATININE 0.72  CALCIUM 9.0   GFR: Estimated Creatinine Clearance: 81.6 mL/min (by C-G formula based on Cr of 0.72). Liver Function Tests: No results for input(s): AST, ALT, ALKPHOS, BILITOT, PROT, ALBUMIN in the last 168 hours. No results for input(s): LIPASE, AMYLASE in the last 168 hours. No results for input(s): AMMONIA in the last 168 hours. Coagulation Profile: No results for input(s): INR, PROTIME in the last 168 hours. Cardiac Enzymes: No results for input(s): CKTOTAL, CKMB, CKMBINDEX, TROPONINI in the last 168 hours. BNP (last 3 results) No  results for input(s): PROBNP in the last 8760 hours. HbA1C: No results for input(s): HGBA1C in the last 72 hours. CBG: No results for input(s): GLUCAP in the last 168 hours. Lipid Profile: No results for input(s): CHOL, HDL, LDLCALC, TRIG, CHOLHDL, LDLDIRECT in the last 72 hours. Thyroid Function Tests: No results for input(s): TSH, T4TOTAL, FREET4, T3FREE, THYROIDAB in the last 72 hours. Anemia Panel: No results for input(s): VITAMINB12, FOLATE, FERRITIN, TIBC, IRON, RETICCTPCT in the last 72 hours. Urine analysis: No results found for: COLORURINE, APPEARANCEUR, LABSPEC, PHURINE, GLUCOSEU, HGBUR, BILIRUBINUR, KETONESUR, PROTEINUR, UROBILINOGEN, NITRITE, LEUKOCYTESUR Sepsis  Labs: @LABRCNTIP (procalcitonin:4,lacticidven:4)  )No results found for this or any previous visit (from the past 240 hour(s)).       Radiology Studies: No results found.      Scheduled Meds: . aspirin  324 mg Oral Daily  . oxyCODONE  5 mg Oral Q4H  . polyethylene glycol  17 g Oral BID  . senna  1 tablet Oral BID  . sertraline  50 mg Oral Daily  . sodium chloride flush  3 mL Intravenous Q12H   Continuous Infusions:    LOS: 2 days    Time spent: [place], MD [facility] ([facility]   If 7PM-7AM, please contact night-coverage [email or web address] Password [facility] 05/14/2016, 11:37 AM

## 2016-05-15 DIAGNOSIS — S32599A Other specified fracture of unspecified pubis, initial encounter for closed fracture: Secondary | ICD-10-CM | POA: Diagnosis not present

## 2016-05-15 MED ORDER — OXYCODONE HCL 5 MG PO TABS: 10.0000 mg | ORAL_TABLET | ORAL | Status: DC

## 2016-05-15 MED ORDER — POLYETHYLENE GLYCOL 3350 17 G PO PACK: 17.0000 g | PACK | Freq: Two times a day (BID) | ORAL | Status: DC

## 2016-05-15 MED ORDER — OXYCODONE HCL 5 MG PO TABS
5.0000 mg | ORAL_TABLET | ORAL | Status: DC
Start: 2016-05-15 — End: 2016-05-15

## 2016-05-15 MED ORDER — METHOCARBAMOL 500 MG PO TABS: 500.0000 mg | ORAL_TABLET | Freq: Four times a day (QID) | ORAL | Status: DC | PRN

## 2016-05-15 MED ORDER — SENNA 8.6 MG PO TABS: 1.0000 | ORAL_TABLET | Freq: Two times a day (BID) | ORAL | Status: DC

## 2016-05-15 MED ORDER — OXYCODONE HCL 5 MG PO TABS
5.0000 mg | ORAL_TABLET | ORAL | Status: DC
  Administered 2016-05-15: 10 mg via ORAL
  Filled 2016-05-15: qty 2

## 2016-05-15 MED ORDER — NAPROXEN 250 MG PO TABS
500.0000 mg | ORAL_TABLET | Freq: Three times a day (TID) | ORAL | Status: DC
  Administered 2016-05-15: 500 mg via ORAL
  Filled 2016-05-15: qty 2

## 2016-05-15 MED ORDER — METHOCARBAMOL 500 MG PO TABS
500.0000 mg | ORAL_TABLET | Freq: Four times a day (QID) | ORAL | Status: DC | PRN
Start: 2016-05-15 — End: 2016-05-15

## 2016-05-15 NOTE — Discharge Summary (Addendum)
Physician Discharge Summary  Connie Knox [id number] DOB: Aug 02, 1961 DOA: 05/12/2016  PCP: Wyatt Haste, MD  Admit date: 05/12/2016 Discharge date: 05/15/2016  Time spent: 25 minutes  Recommendations for Outpatient Follow-up:  1. Hard Rx given for controlled substances 2. Will d/c to [facility] for skilled therapy  Discharge Diagnoses:  Active Problems:   GERD (gastroesophageal reflux disease)   Fracture of multiple pubic rami (HCC)   Sacral fracture (HCC)   Constipation   Depression   Insomnia   Closed fracture of sacrum [facility])   Fall   Discharge Condition: fair  Diet recommendation: reg  Filed Weights   05/12/16 0858 05/12/16 1800 05/12/16 1803  Weight: 71.668 kg (158 lb) 71.668 kg (158 lb) 71.668 kg (158 lb)    History of present illness:  Active 55 year old female Accidental fall downstairs after chasing dog Significant pain and inability to mobilize Brought to emergency room found to have sacral fractures Orthopedics consulted  Hospital Course:  Non operative Pelvic #'s -non op -mobilizing only fair with therapy -Patient awake alert doing well -Has mobilize with therapy who recommend rolling walker with 5  -continue pain management at SNF  She is 54 is a smoker and does drink occasionally so she is at slightly higher risk given her race been Caucasian for osteoporosis I would recommend 50,000 units of vitamin D in addition to Os-Cal supplementation 3 times a day and a vitamin D3 level as an outpatient on discharge   Consultations:  Ortho-T [facility]  Discharge Exam: Filed Vitals:   05/14/16 2013 05/15/16 0612  BP: 99/59 108/55  Pulse: 72 74  Temp: 98.4 F (36.9 C) 98.5 F (36.9 C)  Resp: 16 16    General: eomi ncat in some pain Cardiovascular: s1 s2 no m/r/g Respiratory: clear no added sound  Discharge Instructions    Current Discharge Medication List    START taking these medications   Details  methocarbamol  (ROBAXIN) 500 MG tablet Take 1 tablet (500 mg total) by mouth every 6 (six) hours as needed for muscle spasms. Qty: 60 tablet, Refills: 0    oxyCODONE (OXY IR/ROXICODONE) 5 MG immediate release tablet Take 2 tablets (10 mg total) by mouth every 4 (four) hours. Qty: 30 tablet, Refills: 0    polyethylene glycol (MIRALAX / GLYCOLAX) packet Take 17 g by mouth 2 (two) times daily. Qty: 14 each, Refills: 0    senna (SENOKOT) 8.6 MG TABS tablet Take 1 tablet (8.6 mg total) by mouth 2 (two) times daily. Qty: 120 each, Refills: 0      CONTINUE these medications which have NOT CHANGED   Details  HYDROcodone-acetaminophen (NORCO/VICODIN) 5-325 MG tablet Take 1 tablet by mouth every 6 (six) hours as needed for moderate pain.    sertraline (ZOLOFT) 50 MG tablet Take 50 mg by mouth daily. Refills: 1    zolpidem (AMBIEN) 10 MG tablet Take 10 mg by mouth at bedtime as needed. sleep Refills: 0      STOP taking these medications     ibuprofen (ADVIL,MOTRIN) 200 MG tablet        Allergies  Allergen Reactions  . Aleve [Naproxen Sodium] Palpitations   Follow-up Information    Follow up with [facility].   Why:  THey will contact you to schedule home therapy visits.   Contact information:   [address] [place] [address] [phone]        The results of significant diagnostics from this hospitalization (including imaging, microbiology,  ancillary and laboratory) are listed below for reference.    Significant Diagnostic Studies: Dg Lumbar Spine 2-3 Views  05/12/2016  CLINICAL DATA:  55 year old female with pain after fall. Initial encounter. EXAM: LUMBAR SPINE - 2-3 VIEW COMPARISON:  Left hip series from today reported separately. FINDINGS: Lateral view was done with supine cross-table lateral technique. Normal lumbar segmentation. Preserved lumbar vertebral height and alignment. Relatively preserved disc spaces. Bone mineralization is within normal limits.  Sacrum appears grossly intact. SI joints appear normal. Visible lower thoracic levels appear intact. Left pubic rami fractures partially re- demonstrated. IMPRESSION: 1.  No acute fracture or listhesis identified in the lumbar spine. 2. Left pubic rami fractures, see left hip series. Electronically Signed   By: Genevie Ann M.D.   On: 05/12/2016 10:15   Ct Pelvis Wo Contrast  05/12/2016  CLINICAL DATA:  Fall down stairs last night with left hip pain and limited weight-bearing. Pubic rami fractures on radiographs. EXAM: CT PELVIS WITHOUT CONTRAST TECHNIQUE: Multidetector CT imaging of the pelvis was performed following the standard protocol without intravenous contrast. COMPARISON:  Radiograph same date. FINDINGS: Urinary Tract: The visualized distal ureters appear normal. No evidence of bladder injury related to the pubic rami fractures. There is no free pelvic fluid. Bowel: No bowel wall thickening, distention or surrounding inflammation identified within the pelvis. Moderate stool throughout the colon. The appendix appears normal. Vascular/Lymphatic: No enlarged pelvic lymph nodes identified. No significant vascular findings. Reproductive: The uterus and ovaries appear normal. No evidence of adnexal mass. Other: No free pelvic fluid. Musculoskeletal: As demonstrated on earlier radiographs, there are fractures of the left superior and inferior pubic rami. The superior pubic ramus fracture demonstrates up to 6 mm of displacement with a component extending towards the bladder. No definite bladder injury. There is a nondisplaced fracture of the left sacral ala. No right-sided pelvic fractures identified. The acetabula, symphysis pubis and sacroiliac joints appear intact. The proximal femurs are intact. There is an os acetabuli on the left. There is mild soft tissue swelling around the fractures, but no significant hematoma. IMPRESSION: 1. Left superior and inferior pubic rami fractures as described. The superior pubic  ramus fracture is displaced. 2. Nondisplaced fractures of the left sacral ala. 3. No evidence of bladder injury. Electronically Signed   By: Richardean Sale M.D.   On: 05/12/2016 12:02   Dg Hip Unilat With Pelvis 2-3 Views Left  05/12/2016  CLINICAL DATA:  55 year old female with left hip pain after fall. Initial encounter. EXAM: DG HIP (WITH OR WITHOUT PELVIS) 2-3V LEFT COMPARISON:  None. FINDINGS: Comminuted fractures of the lateral aspect of the left superior pubic ramus and more central aspect of the left inferior pubic ramus. The left acetabulum appears spared. The femoral heads remain normally located. The proximal left femur appears intact. No other acute pelvic fracture identified. Grossly intact sacral ala. Proximal right femur appears intact. Underlying bone mineralization appears normal. IMPRESSION: Comminuted fractures of the left superior and inferior pubic rami. Proximal left femur appears intact. Electronically Signed   By: Genevie Ann M.D.   On: 05/12/2016 10:13   Dg Femur Min 2 Views Left  05/12/2016  CLINICAL DATA:  55 year old female with pain after fall. Initial encounter. EXAM: LEFT FEMUR 2 VIEWS COMPARISON:  Left hip series from today reported separately. FINDINGS: Partially re- demonstrated left pubic rami fractures. The proximal left femur was included on the comparison. The left femur intertrochanteric region, left femoral shaft, and distal left femur are intact. Alignment at  the left knee is preserved. No left knee joint effusion identified. IMPRESSION: 1.  No acute fracture or dislocation identified in the left femur. 2. Left pubic rami fractures, see left hip series. Electronically Signed   By: Genevie Ann M.D.   On: 05/12/2016 10:16    Microbiology: No results found for this or any previous visit (from the past 240 hour(s)).   Labs: Basic Metabolic Panel:  Recent Labs Lab 05/13/16 0638  NA 139  K 4.0  CL 104  CO2 26  GLUCOSE 87  BUN 13  CREATININE 0.72  CALCIUM 9.0    Liver Function Tests: No results for input(s): AST, ALT, ALKPHOS, BILITOT, PROT, ALBUMIN in the last 168 hours. No results for input(s): LIPASE, AMYLASE in the last 168 hours. No results for input(s): AMMONIA in the last 168 hours. CBC:  Recent Labs Lab 05/13/16 0638  WBC 8.0  HGB 11.7*  HCT 36.9  MCV 101.4*  PLT 237   Cardiac Enzymes: No results for input(s): CKTOTAL, CKMB, CKMBINDEX, TROPONINI in the last 168 hours. BNP: BNP (last 3 results) No results for input(s): BNP in the last 8760 hours.  ProBNP (last 3 results) No results for input(s): PROBNP in the last 8760 hours.  CBG: No results for input(s): GLUCAP in the last 168 hours.     SignedNita Sells MD   [facility] 05/15/2016, 2:02 PM

## 2016-05-15 NOTE — Progress Notes (Signed)
Occupational Therapy Treatment Patient Details Name: Connie Knox MRN: [id number] DOB: 1960-12-21 Today's Date: 05/15/2016    History of present illness 55 y.o. s/p fall downstairs with resulting Left lateral compression pelvic fracture with sacral involvement. WBAT. PMH: panic attacks   OT comments  Pt progressing. Education provided in session. Pt will not have much assistance at home. Updated d/c recommendation, but may be able to progress to [facility]. Discussed possible d/c options in session.  Follow Up Recommendations  SNF;Supervision - Intermittent    Equipment Recommendations  3 in 1 bedside comode;Other (comment) (AE)    Recommendations for Other Services      Precautions / Restrictions Precautions Precautions: Fall Restrictions Weight Bearing Restrictions: Yes RLE Weight Bearing: Weight bearing as tolerated LLE Weight Bearing: Weight bearing as tolerated       Mobility Bed Mobility Overal bed mobility: Needs Assistance Bed Mobility: Supine to Sit     Supine to sit: Max assist     General bed mobility comments: assist with LLE and also with scooting hips out as well as assisting trunk to sitting position. attempted to try not to use rails for bed mobility but ended up using them some. Educated on techniques to help assist LLE.  Transfers Overall transfer level: Needs assistance Equipment used: Rolling walker (2 wheeled) Transfers: Sit to/from Stand Sit to Stand: Min assist         General transfer comment: Min A for sit to stand from 3 in 1. Min guard for sit to stand from bed.    Balance        Min guard for ambulation with RW. Min assist for sit to stand from 3 in 1.                           ADL Overall ADL's : Needs assistance/impaired   Eating/Feeding Details (indicate cue type and reason): drank water while sitting on 3 in 1 without difficulty-independent                 Lower Body Dressing: Supervision/safety;With  adaptive equipment;Set up;Sitting/lateral leans Lower Body Dressing Details (indicate cue type and reason): donning/doffing sock Toilet Transfer: Minimal assistance;Ambulation;BSC;RW   Toileting- Water quality scientist and Hygiene: Min guard;Sit to/from stand       Functional mobility during ADLs: Minimal assistance;Rolling walker (Min A-sit to stand from [facility]; Min guard-ambulation) General ADL Comments: Educated on AE. Educated on LB dressing technique.       Vision                     Perception     Praxis      Cognition  Awake/Alert Behavior During Therapy: WFL for tasks assessed/performed Overall Cognitive Status: Within Functional Limits for tasks assessed                       Extremity/Trunk Assessment               Exercises     Shoulder Instructions       General Comments      Pertinent Vitals/ Pain       Pain Assessment: 0-10 Pain Score: 9  Pain Location: LLE/pelvis to head when moving and also IV site Pain Descriptors / Indicators: Shooting Pain Intervention(s): Monitored during session;Repositioned  Home Living  Prior Functioning/Environment              Frequency Min 2X/week     Progress Toward Goals  OT Goals(current goals can now be found in the care plan section)  Progress towards OT goals: Progressing toward goals  Acute Rehab OT Goals Patient Stated Goal: not stated OT Goal Formulation: With patient Time For Goal Achievement: 05/27/16 Potential to Achieve Goals: Good ADL Goals Pt Will Perform Lower Body Bathing: with min guard assist;with adaptive equipment;sit to/from stand;with caregiver independent in assisting Pt Will Perform Lower Body Dressing: with caregiver independent in assisting;sit to/from stand;with adaptive equipment;with min guard assist Pt Will Transfer to Toilet: with min guard assist;bedside commode (caregiver independent in  assisting) Pt Will Perform Toileting - Clothing Manipulation and hygiene: with set-up;sit to/from stand Pt Will Perform Tub/Shower Transfer: ambulating;3 in 1;with min guard assist;rolling walker;with caregiver independent in assisting  Plan Discharge plan needs to be updated;Equipment recommendations need to be updated    Co-evaluation                 End of Session Equipment Utilized During Treatment: Gait belt;Rolling walker;Other (comment) (AE)   Activity Tolerance Patient tolerated treatment well   Patient Left in chair;with call bell/phone within reach   Nurse Communication Mobility status        Time: [id number] OT Time Calculation (min): 21 min  Charges: OT General Charges $OT Visit: 1 Procedure OT Treatments $Self Care/Home Management : 8-22 mins  Benito Mccreedy OTR/L [id number] 05/15/2016, 10:35 AM

## 2016-05-15 NOTE — Progress Notes (Signed)
Per MD, Pt ready for d/c.  [facility] able to take Pt today.  They are ready to receive her.  Pt in agreement with transfer to [facility].  Husband to provide transportation.  Bernita Raisin, [facility] [phone]

## 2016-05-15 NOTE — Progress Notes (Signed)
Report given [facility].

## 2016-05-15 NOTE — Progress Notes (Signed)
Physical Therapy Treatment Patient Details Name: Connie Knox MRN: [id number] DOB: 01-10-1961 Today's Date: 05/15/2016    History of Present Illness 55 y.o. s/p fall downstairs with resulting Left lateral compression pelvic fracture with sacral involvement. WBAT. PMH: panic attacks    PT Comments    The patient is slowly progressing. Limited by pain at times and relies heavily on upper body for spport. Simulated a small step up backward in an  Attempt  To bear weight on the  Left leg to step back with right leg. The left leg does not support  The weight/increased pain. The pain has no caregivers  During day. Currently requires assistance for all aspects of mobility. . The patient will benefit from post acute rehab with goals for modified independence.  Follow Up Recommendations  SNF;Supervision/Assistance - 24 hour     Equipment Recommendations  Rolling walker with 5" wheels    Recommendations for Other Services       Precautions / Restrictions Precautions Precautions: Fall Restrictions RLE Weight Bearing: Weight bearing as tolerated LLE Weight Bearing: Weight bearing as tolerated    Mobility  Bed Mobility Overal bed mobility: Needs Assistance Bed Mobility: Sit to Supine       Sit to supine: Max assist   General bed mobility comments: assist with both legs onto the bed, extra time to work through the poain onset  Transfers Overall transfer level: Needs assistance Equipment used: Rolling walker (2 wheeled) Transfers: Sit to/from Stand Sit to Stand: Mod assist         General transfer comment: Mod. assistance to power up from recliner, extra time to position self, manually has to move the  LLE back in prep to stand.   patient reports being very stiff from sitting. Standing from [facility] was  somewhat easier as well as a higher seat.  Ambulation/Gait Ambulation/Gait assistance: Min assist Ambulation Distance (Feet): 25 Feet (x 2) Assistive device: Rolling walker (2  wheeled) Gait Pattern/deviations: Step-to pattern;Antalgic;Decreased step length - left;Decreased stance time - left Gait velocity: very slow   General Gait Details: relies on the arms for support on the RW during ambulation as increased pain with weight bear on the LLE. Able to stand and balance on the RLE and prop on the sink  to wash hands.   Stairs            Wheelchair Mobility    Modified Rankin (Stroke Patients Only)       Balance             Standing balance-Leahy Scale: Poor                      Cognition Arousal/Alertness: Awake/alert                          Exercises      General Comments        Pertinent Vitals/Pain Pain Score: 6  Pain Location: L pelvis ant and  and posterior Pain Descriptors / Indicators: Discomfort;Grimacing;Guarding;Sharp;Penetrating Pain Intervention(s): Limited activity within patient's tolerance;Monitored during session;Premedicated before session;Repositioned    Home Living                      Prior Function            PT Goals (current goals can now be found in the care plan section) Progress towards PT goals: Progressing toward goals    Frequency  Min 5X/week    PT Plan Discharge plan needs to be updated    Co-evaluation             End of Session   Activity Tolerance: Patient limited by pain;Patient limited by fatigue Patient left: in bed;with call bell/phone within reach     Time: 1422-1452 PT Time Calculation (min) (ACUTE ONLY): 30 min  Charges:  $Gait Training: 8-22 mins $Self Care/Home Management: 8-22                    G Codes:      Marcelino Freestone PT [id number]  05/15/2016, 3:14 PM

## 2016-05-15 NOTE — Progress Notes (Addendum)
Per Dr. Verlon Au, d/c plan is SNF vs home with [facility], DME, and a hospital bed. OT is recommending SNF. Met with pt at bedside. Member agrees with SNF. SW to f/u.

## 2016-05-22 NOTE — ED Provider Notes (Signed)
CSN: [id number]     Arrival date & time 05/12/16  [id number] History   First MD Initiated Contact with Patient 05/12/16 [phone]     Chief Complaint  Patient presents with  . Fall  . Leg Pain     HPI  Patient presents for evaluation of left hip pain. Was up during the night last night and fell down multiple stairs. She states she has approximately 12+4 stairs on 2 different runs of stairway. With all the way down to the bottom. She is uncertain if she tumbled or she scooted. She was able to get back into bed with great deal of pain and help last night. However, painful to attempt walking today. Brought in by her husband for evaluation. No head injury. No neck or back pain. No arm or extremity pain.  Past Medical History  Diagnosis Date  . GERD (gastroesophageal reflux disease)   . Dyslipidemia   . Panic attack   . Pubic ramus fracture ([facility]) 05/11/2016    "fell down carpeted steps"  . Arthritis     "knees" (05/12/2016)   Past Surgical History  Procedure Laterality Date  . Dilation and curettage of uterus  X 9    "all miscarriages"   Family History  Problem Relation Age of Onset  . Cancer Maternal Grandmother   . Cancer Maternal Grandfather   . Stroke Maternal Grandfather   . Ulcers Maternal Grandfather   . Cancer Paternal Grandmother   . Cancer Paternal Grandfather    Social History  Substance Use Topics  . Smoking status: Former Research scientist (life sciences)  . Smokeless tobacco: Never Used     Comment: "smoked cigarettes as a teen"  . Alcohol Use: 8.4 oz/week    14 Shots of liquor per week     Comment: 05/12/2016 "2 shots vodka/day"   OB History    No data available     Review of Systems  Constitutional: Negative for fever, chills, diaphoresis, appetite change and fatigue.  HENT: Negative for mouth sores, sore throat and trouble swallowing.   Eyes: Negative for visual disturbance.  Respiratory: Negative for cough, chest tightness, shortness of breath and wheezing.   Cardiovascular: Negative for  chest pain.  Gastrointestinal: Negative for nausea, vomiting, abdominal pain, diarrhea and abdominal distention.  Endocrine: Negative for polydipsia, polyphagia and polyuria.  Genitourinary: Negative for dysuria, frequency and hematuria.  Musculoskeletal: Negative for gait problem.       Left hip and pelvic pain  Skin: Negative for color change, pallor and rash.  Neurological: Negative for dizziness, syncope, light-headedness and headaches.  Hematological: Does not bruise/bleed easily.  Psychiatric/Behavioral: Negative for behavioral problems and confusion.      Allergies  Aleve  Home Medications   Prior to Admission medications   Medication Sig Start Date End Date Taking? Authorizing Provider  HYDROcodone-acetaminophen (NORCO/VICODIN) 5-325 MG tablet Take 1 tablet by mouth every 6 (six) hours as needed for moderate pain.   Yes Historical Provider, MD  sertraline (ZOLOFT) 50 MG tablet Take 50 mg by mouth daily. 04/17/16  Yes Historical Provider, MD  zolpidem (AMBIEN) 10 MG tablet Take 10 mg by mouth at bedtime as needed. sleep 02/13/16  Yes Historical Provider, MD  methocarbamol (ROBAXIN) 500 MG tablet Take 1 tablet (500 mg total) by mouth every 6 (six) hours as needed for muscle spasms. 05/15/16   Nita Sells, MD  oxyCODONE (OXY IR/ROXICODONE) 5 MG immediate release tablet Take 2 tablets (10 mg total) by mouth every 4 (four) hours. 05/15/16  Nita Sells, MD  polyethylene glycol (MIRALAX / GLYCOLAX) packet Take 17 g by mouth 2 (two) times daily. 05/15/16   Nita Sells, MD  senna (SENOKOT) 8.6 MG TABS tablet Take 1 tablet (8.6 mg total) by mouth 2 (two) times daily. 05/15/16   Nita Sells, MD   BP 110/58 mmHg  Pulse 75  Temp(Src) 98.3 F (36.8 C) (Oral)  Resp 16  Ht 5\' 6"  (1.676 m)  Wt 158 lb (71.668 kg)  BMI 25.51 kg/m2  SpO2 97% Physical Exam  Constitutional: She is oriented to person, place, and time. She appears well-developed and well-nourished.  No distress.  HENT:  Head: Normocephalic.  Eyes: Conjunctivae are normal. Pupils are equal, round, and reactive to light. No scleral icterus.  Neck: Normal range of motion. Neck supple. No thyromegaly present.  Cardiovascular: Normal rate and regular rhythm.  Exam reveals no gallop and no friction rub.   No murmur heard. Pulmonary/Chest: Effort normal and breath sounds normal. No respiratory distress. She has no wheezes. She has no rales.  Abdominal: Soft. Bowel sounds are normal. She exhibits no distension. There is no tenderness. There is no rebound.  Musculoskeletal: Normal range of motion.       Legs: Neurological: She is alert and oriented to person, place, and time.  Skin: Skin is warm and dry. No rash noted.  Psychiatric: She has a normal mood and affect. Her behavior is normal.    ED Course  Procedures (including critical care time) Labs Review Labs Reviewed  CBC - Abnormal; Notable for the following:    RBC 3.64 (*)    Hemoglobin 11.7 (*)    MCV 101.4 (*)    All other components within normal limits  BASIC METABOLIC PANEL    Imaging Review No results found. I have personally reviewed and evaluated these images and lab results as part of my medical decision-making.   EKG Interpretation None      MDM   Final diagnoses:  Fracture of multiple pubic rami, left, closed, initial encounter ([place])  Closed fracture of sacrum, unspecified fracture morphology, initial encounter [facility])    Patient with significant pain even after IV pain medications. I discussed the case with orthopedics. Also with [facility], Dr. Barbaraann Faster. Patient will be admitted. Physical therapy evaluation pain control.    Tanna Furry, MD 05/22/16 [phone]

## 2016-05-31 NOTE — Progress Notes (Signed)
OT Addendum    2016/05/24 1108  OT Visit Information  Last OT Received On 05/24/16  OT G-codes **NOT FOR INPATIENT CLASS**  Functional Assessment Tool Used clinical judgement  Functional Limitation Self care  Self Care Current Status [id number]) CK  Self Care Goal Status [id number]) CI  [facility], OTR/L  [phone] May 24, 2016

## 2016-06-01 NOTE — Progress Notes (Signed)
Late entry for missing G-code   06/09/16 1136  PT G-Codes **NOT FOR INPATIENT CLASS**  Functional Assessment Tool Used clinical judgment  Functional Limitation Mobility: Walking and moving around  Mobility: Walking and Moving Around Current Status [id number]) CL  Mobility: Walking and Moving Around Goal Status [id number]) CK  Cassell Clement, PT, CSCS Pager [phone] Office [phone]

## 2016-08-10 ENCOUNTER — Other Ambulatory Visit: Payer: Self-pay | Admitting: Obstetrics and Gynecology

## 2016-08-10 DIAGNOSIS — R928 Other abnormal and inconclusive findings on diagnostic imaging of breast: Secondary | ICD-10-CM

## 2016-08-12 ENCOUNTER — Ambulatory Visit
Admission: RE | Admit: 2016-08-12 | Discharge: 2016-08-12 | Disposition: A | Discharge status: Home / Self Care | Payer: 59 | Source: Ambulatory Visit | Attending: Obstetrics and Gynecology | Admitting: Obstetrics and Gynecology

## 2016-08-12 ENCOUNTER — Other Ambulatory Visit: Payer: Self-pay | Admitting: Obstetrics and Gynecology

## 2016-08-12 DIAGNOSIS — R928 Other abnormal and inconclusive findings on diagnostic imaging of breast: Secondary | ICD-10-CM

## 2016-08-13 ENCOUNTER — Other Ambulatory Visit: Payer: Self-pay | Admitting: Obstetrics and Gynecology

## 2016-08-13 DIAGNOSIS — R928 Other abnormal and inconclusive findings on diagnostic imaging of breast: Secondary | ICD-10-CM

## 2016-08-16 ENCOUNTER — Ambulatory Visit
Admission: RE | Admit: 2016-08-16 | Discharge: 2016-08-16 | Disposition: A | Discharge status: Home / Self Care | Payer: 59 | Source: Ambulatory Visit | Attending: Obstetrics and Gynecology | Admitting: Obstetrics and Gynecology

## 2016-08-16 DIAGNOSIS — R928 Other abnormal and inconclusive findings on diagnostic imaging of breast: Secondary | ICD-10-CM

## 2016-09-01 ENCOUNTER — Encounter: Payer: Self-pay | Admitting: Family Medicine

## 2017-02-10 LAB — HM COLONOSCOPY

## 2017-02-14 DIAGNOSIS — D126 Benign neoplasm of colon, unspecified: Secondary | ICD-10-CM | POA: Insufficient documentation

## 2017-02-15 ENCOUNTER — Encounter: Payer: Self-pay | Admitting: Family Medicine

## 2018-06-30 ENCOUNTER — Ambulatory Visit: Payer: 59 | Admitting: Family Medicine

## 2018-06-30 ENCOUNTER — Encounter: Payer: Self-pay | Admitting: Family Medicine

## 2018-06-30 VITALS — BP 116/77 | HR 83 | Temp 98.5°F | Resp 16 | Ht 66.0 in | Wt 159.0 lb

## 2018-06-30 DIAGNOSIS — J0100 Acute maxillary sinusitis, unspecified: Secondary | ICD-10-CM

## 2018-06-30 DIAGNOSIS — Z7689 Persons encountering health services in other specified circumstances: Secondary | ICD-10-CM | POA: Diagnosis not present

## 2018-06-30 MED ORDER — CEFDINIR 300 MG PO CAPS: 300.0000 mg | ORAL_CAPSULE | Freq: Two times a day (BID) | ORAL | 0 refills | Status: DC

## 2018-06-30 NOTE — Progress Notes (Signed)
Patient ID: Wilford Grist, female  DOB: 09-14-1961, 57 y.o.   MRN: [id number] Patient Care Team    Relationship Specialty Notifications Start End  Ma Hillock, DO PCP - General Family Medicine  06/30/18   Juluis Rainier  Optometry  07/03/18   Everlene Farrier, MD Consulting Physician Obstetrics and Gynecology  07/03/18   Richmond Campbell, MD Consulting Physician Gastroenterology  07/03/18     Chief Complaint  Patient presents with  . Establish Care    cough and head congestion    Subjective:  DULSE RUTAN is a 57 y.o.  female present for new patient establishment. All past medical history, surgical history, allergies, family history, immunizations, medications and social history were updated in the electronic medical record today. All recent labs, ED visits and hospitalizations within the last year were reviewed.  Cough: Presents today with a one-month history of sinus pressure and headache.  She endorses nasal drainage and congestion.  Bilateral ear pressure.  And cough.  Her husband has been sick off and on over the last month with recent diagnosis of bronchitis.  She reports she has not been ill in many years and has not required an antibiotic in over 10 years.  Depression screen [facility] 2/9 06/30/2018  Decreased Interest 0  Down, Depressed, Hopeless 0  PHQ - 2 Score 0   No flowsheet data found.    Current Exercise Habits: Structured exercise class, Type of exercise: strength training/weights(kick boxing), Time (Minutes): 30, Frequency (Times/Week): 5, Weekly Exercise (Minutes/Week): 150, Intensity: Moderate Exercise limited by: cardiac condition(s) Fall Risk  06/30/2018  Falls in the past year? No      There is no immunization history on file for this patient.  No exam data present  Past Medical History:  Diagnosis Date  . Adenomatous colon polyp   . Arthritis    "knees" (05/12/2016)  . Chicken pox   . Depression   . Dyslipidemia   . GERD  (gastroesophageal reflux disease)   . Panic attack   . Pubic ramus fracture ([place]) 05/11/2016   We will down carpeted steps.  Pubic ramus fracture, sacral fracture.   Allergies  Allergen Reactions  . Aleve [Naproxen Sodium] Palpitations   Past Surgical History:  Procedure Laterality Date  . DILATION AND CURETTAGE OF UTERUS  X 9   "all miscarriages"   Family History  Problem Relation Age of Onset  . Lung cancer Maternal Grandmother   . Stroke Maternal Grandfather   . Ulcers Maternal Grandfather   . Breast cancer Paternal Grandmother   . Stomach cancer Paternal Grandfather    Social History   Socioeconomic History  . Marital status: Married    Spouse name: Not on file  . Number of children: Not on file  . Years of education: Not on file  . Highest education level: Not on file  Occupational History  . Not on file  Social Needs  . Financial resource strain: Not on file  . Food insecurity:    Worry: Not on file    Inability: Not on file  . Transportation needs:    Medical: Not on file    Non-medical: Not on file  Tobacco Use  . Smoking status: Former Research scientist (life sciences)  . Smokeless tobacco: Never Used  . Tobacco comment: "smoked cigarettes as a teen"  Substance and Sexual Activity  . Alcohol use: Yes    Alcohol/week: 8.4 oz    Types: 14 Shots of liquor per week  Comment: 05/12/2016 "2 shots vodka/day"  . Drug use: No  . Sexual activity: Yes    Partners: Male  Lifestyle  . Physical activity:    Days per week: Not on file    Minutes per session: Not on file  . Stress: Not on file  Relationships  . Social connections:    Talks on phone: Not on file    Gets together: Not on file    Attends religious service: Not on file    Active member of club or organization: Not on file    Attends meetings of clubs or organizations: Not on file    Relationship status: Not on file  . Intimate partner violence:    Fear of current or ex partner: Not on file    Emotionally abused: Not on  file    Physically abused: Not on file    Forced sexual activity: Not on file  Other Topics Concern  . Not on file  Social History Narrative   Married.  2 children.   Bachelors degree.  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   Drinks alcohol socially.  Former smoker.   Exercises routinely.   Eats dairy products.   Drinks caffeine.   Had smoke alarm in the home.   Wears her seatbelt.   Feels safe in her relationships.   Allergies as of 06/30/2018      Reactions   Aleve [naproxen Sodium] Palpitations      Medication List        Accurate as of 06/30/18 11:59 PM. Always use your most recent med list.          cefdinir 300 MG capsule Commonly known as:  OMNICEF Take 1 capsule (300 mg total) by mouth 2 (two) times daily.   zolpidem 10 MG tablet Commonly known as:  AMBIEN Take 10 mg by mouth at bedtime as needed. sleep       All past medical history, surgical history, allergies, family history, immunizations andmedications were updated in the EMR today and reviewed under the history and medication portions of their EMR.    No results found for this or any previous visit (from the past 2160 hour(s)).  ROS: 14 pt review of systems performed and negative (unless mentioned in an HPI)  Objective: BP 116/77 (BP Location: Right Arm, Patient Position: Sitting, Cuff Size: Normal)   Pulse 83   Temp 98.5 F (36.9 C) (Oral)   Resp 16   Ht 5\' 6"  (1.676 m)   Wt 159 lb (72.1 kg)   SpO2 96%   BMI 25.66 kg/m  Gen: Afebrile. No acute distress. Nontoxic in appearance, well-developed, well-nourished, pleasant Caucasian female. HENT: AT. . Bilateral TM visualized and normal in appearance, normal external auditory canal. MMM, no oral lesions, adequate dentition. Bilateral nares with edema, swelling and drainage. Throat without erythema, ulcerations or exudates.  Mild cough on exam, mild hoarseness on exam. Eyes:Pupils Equal Round Reactive to light, Extraocular  movements intact,  Conjunctiva without redness, discharge or icterus. Neck/lymp/endocrine: Supple, no lymphadenopathy CV: RRR no murmur, no edema. Chest: CTAB, no wheeze, rhonchi or crackles.  Normal respiratory effort.  Good air movement. Abd: Soft. NTND. BS present.  No masses palpated. No hepatosplenomegaly. No rebound tenderness or guarding. Skin: No rashes, purpura or petechiae. Warm and well-perfused. Skin intact. Neuro/Msk:  Normal gait. PERLA. EOMi. Alert. Oriented x3.   Psych: Normal affect, dress and demeanor. Normal speech. Normal thought content and judgment.  Assessment/plan: MECKENZIE BALSLEY is a 58 y.o. female present for establishment of care with acute illness. Acute non-recurrent maxillary sinusitis Rest, hydrate.  +/- flonase, mucinex (DM if cough), nettie pot or nasal saline.  Omnicef prescribed, take until completed.  If cough present it can last up to 6-8 weeks.  F/U 2 weeks of not improved.    Return in about 1 month (around 07/28/2018), or if symptoms worsen or fail to improve.   Note is dictated utilizing voice recognition software. Although note has been proof read prior to signing, occasional typographical errors still can be missed. If any questions arise, please do not hesitate to call for verification.  Electronically signed by: Howard Pouch, DO [place]

## 2018-06-30 NOTE — Patient Instructions (Signed)
It was nice to see you today.  Rest, hydrate.  +/- flonase, mucinex (DM if cough), nettie pot or nasal saline.  omnicef prescribed, take until completed.  If cough present it can last up to 6-8 weeks.  F/U 2 weeks of not improved.     Sinusitis, Adult Sinusitis is soreness and inflammation of your sinuses. Sinuses are hollow spaces in the bones around your face. They are located:  Around your eyes.  In the middle of your forehead.  Behind your nose.  In your cheekbones.  Your sinuses and nasal passages are lined with a stringy fluid (mucus). Mucus normally drains out of your sinuses. When your nasal tissues get inflamed or swollen, the mucus can get trapped or blocked so air cannot flow through your sinuses. This lets bacteria, viruses, and funguses grow, and that leads to infection. Follow these instructions at home: Medicines  Take, use, or apply over-the-counter and prescription medicines only as told by your doctor. These may include nasal sprays.  If you were prescribed an antibiotic medicine, take it as told by your doctor. Do not stop taking the antibiotic even if you start to feel better. Hydrate and Humidify  Drink enough water to keep your pee (urine) clear or pale yellow.  Use a cool mist humidifier to keep the humidity level in your home above 50%.  Breathe in steam for 10-15 minutes, 3-4 times a day or as told by your doctor. You can do this in the bathroom while a hot shower is running.  Try not to spend time in cool or dry air. Rest  Rest as much as possible.  Sleep with your head raised (elevated).  Make sure to get enough sleep each night. General instructions  Put a warm, moist washcloth on your face 3-4 times a day or as told by your doctor. This will help with discomfort.  Wash your hands often with soap and water. If there is no soap and water, use hand sanitizer.  Do not smoke. Avoid being around people who are smoking (secondhand smoke).  Keep  all follow-up visits as told by your doctor. This is important. Contact a doctor if:  You have a fever.  Your symptoms get worse.  Your symptoms do not get better within 10 days. Get help right away if:  You have a very bad headache.  You cannot stop throwing up (vomiting).  You have pain or swelling around your face or eyes.  You have trouble seeing.  You feel confused.  Your neck is stiff.  You have trouble breathing. This information is not intended to replace advice given to you by your health care provider. Make sure you discuss any questions you have with your health care provider. Document Released: 05/24/2008 Document Revised: 08/01/2016 Document Reviewed: 10/01/2015 Elsevier Interactive Patient Education  [employer].

## 2018-07-03 ENCOUNTER — Encounter: Payer: Self-pay | Admitting: Family Medicine

## 2018-09-20 DIAGNOSIS — Z01419 Encounter for gynecological examination (general) (routine) without abnormal findings: Secondary | ICD-10-CM | POA: Diagnosis not present

## 2018-09-20 DIAGNOSIS — Z6824 Body mass index (BMI) 24.0-24.9, adult: Secondary | ICD-10-CM | POA: Diagnosis not present

## 2018-12-20 DIAGNOSIS — C4491 Basal cell carcinoma of skin, unspecified: Secondary | ICD-10-CM

## 2018-12-20 HISTORY — DX: Basal cell carcinoma of skin, unspecified: C44.91

## 2019-02-23 ENCOUNTER — Telehealth: Payer: Self-pay | Admitting: Family Medicine

## 2019-02-23 NOTE — Telephone Encounter (Signed)
Called and spoke with patient and she stated she just wanted to go to Dermatologist and did not need a referral with her insurance. She wanted to know if we have a Paediatric nurse in [place] [employer]. Pt was told we do not and she should contact her insurance company for preferred MD's. Pt verbalized understanding

## 2019-02-23 NOTE — Telephone Encounter (Signed)
Copied from [place] [id number]. Topic: General - Other >> Feb 23, 2019 12:25 PM Keene Breath wrote: Reason for CRM: Patient called to request an order for a dermatologist.  She stated she has an open wound on her chest and wanted to see a dermatologist.  Did not want an appointment with a PCP, just a dermatologist.  Please advise and call patient back at [phone]

## 2019-03-20 ENCOUNTER — Encounter: Payer: Self-pay | Admitting: Family Medicine

## 2019-03-20 ENCOUNTER — Other Ambulatory Visit: Payer: Self-pay

## 2019-03-20 ENCOUNTER — Ambulatory Visit (INDEPENDENT_AMBULATORY_CARE_PROVIDER_SITE_OTHER): Payer: 59 | Admitting: Family Medicine

## 2019-03-20 DIAGNOSIS — H9209 Otalgia, unspecified ear: Secondary | ICD-10-CM | POA: Diagnosis not present

## 2019-03-20 DIAGNOSIS — L989 Disorder of the skin and subcutaneous tissue, unspecified: Secondary | ICD-10-CM

## 2019-03-20 MED ORDER — CEFDINIR 300 MG PO CAPS: 300.0000 mg | ORAL_CAPSULE | Freq: Two times a day (BID) | ORAL | 0 refills | Status: DC

## 2019-03-20 MED ORDER — FLUTICASONE PROPIONATE 50 MCG/ACT NA SUSP: 2.0000 | Freq: Every day | NASAL | 1 refills | Status: DC

## 2019-03-20 NOTE — Patient Instructions (Signed)
telehealth

## 2019-03-20 NOTE — Progress Notes (Signed)
Virtual Visit via Video   I connected with Connie Knox  on 03/20/19 at  1:00 PM EDT by a video enabled telemedicine application and verified that I am speaking with the correct person using two identifiers. Location patient: Home Location provider: [facility], Office Persons participating in the virtual visit: Patient, Dr. Raoul Pitch and R.Baker, LPN  I discussed the limitations of evaluation and management by telemedicine and the availability of in person appointments. The patient expressed understanding and agreed to proceed.  Subjective:   Chief Complaint  Patient presents with  . Ear Pain    Right ear pain x2 weeks. Pt is deaf in right ear. Pt is taking Tylenol. Right side of throat is hurting also.   Marland Kitchen Abrasion    Pt has place on chest that is a open wound that will come off then black scab grows over within 24 hours. It is painful. Been there for 2 yrs     HPI:  Right ear pain: Patient reports she has had right ear pain for approximately 2 weeks.  This is the ear that she is already deaf.  She is taking Tylenol which seems to be helpful with the discomfort.  She also has very mild right sided sore throat.  SHe denies any nasal congestion, cough, fever, chills, nausea, vomit or rash.  SHe denies any COVID-19 exposure that she is aware.  SHe does not take an allergy medication at this time.  Skin lesion:  Lesion has been present for approximately 2 years.  It is located in the midline of her chest.  She states it has grown slowly over the 2 years and is now more ulcerated nonhealing and bleeding on occasions.  She has no prior history of skin cancers.  ROS: See pertinent positives and negatives per HPI.  Patient Active Problem List   Diagnosis Date Noted  . Otalgia 03/20/2019  . Skin lesion 03/20/2019  . Adenomatous colon polyp 02/14/2017  . GERD (gastroesophageal reflux disease) 06/11/2011  . Dyslipidemia 06/11/2011    Social History   Tobacco Use  . Smoking  status: Former Research scientist (life sciences)  . Smokeless tobacco: Never Used  . Tobacco comment: "smoked cigarettes as a teen"  Substance Use Topics  . Alcohol use: Yes    Alcohol/week: 14.0 standard drinks    Types: 14 Shots of liquor per week    Comment: 05/12/2016 "2 shots vodka/day"    Current Outpatient Medications:  Marland Kitchen  Multiple Vitamin (MULTI-VITAMIN DAILY PO), Take 1 tablet by mouth daily., Disp: , Rfl:  .  zolpidem (AMBIEN) 10 MG tablet, Take 10 mg by mouth at bedtime as needed. sleep, Disp: , Rfl: 0  Allergies  Allergen Reactions  . Aleve [Naproxen Sodium] Palpitations    Objective:  Gen: No acute distress. Nontoxic in appearance.  HENT: AT. [place].  MMM.  Eyes:Pupils Equal Round Reactive to light, Extraocular movements intact,  Conjunctiva without redness, discharge or icterus. Chest: Cough not present.  Skin: no rashes, purpura or petechiae. Raised red skin lesion midline chest.  Neuro: Alert. Oriented x3   Assessment and Plan:  Otalgia, unspecified laterality - treat as sinus drainage-infection given persistent duration. Rest, hydrate.  Prescribed  flonase. omnicef prescribed, take until completed.  F/U 2 weeks of not improved. --> would need to see in office if not resp. Symptoms and ear symptoms only.    Skin lesion - discussed SCC vs BCC--> referral to dermatology placed.     Howard Pouch, DO 03/20/2019

## 2019-03-26 ENCOUNTER — Telehealth: Payer: Self-pay

## 2019-03-26 NOTE — Telephone Encounter (Signed)
Copied from [place] [id number]. Topic: Referral - Status >> Mar 26, 2019  8:49 AM Carolyn Stare wrote:  Pt is following up on her referral to see a dermatologist and said she can't wait 6 months to see one she think her spot is getting worse.   Pt cleaned wound Sat and has another brown spot beside the wound that has been there so she feels as though it is growing in size. Pt states it is not worse just getting larger. Pt states she does not need a referral to go to Derm. She was advised to start calling some places and asking if they are seeing patients at this time or offering virtual visits. Sent to Beverlee Nims to see where referral is at in the process.

## 2019-03-30 DIAGNOSIS — L570 Actinic keratosis: Secondary | ICD-10-CM | POA: Diagnosis not present

## 2019-03-30 DIAGNOSIS — L814 Other melanin hyperpigmentation: Secondary | ICD-10-CM | POA: Diagnosis not present

## 2019-03-30 DIAGNOSIS — C44519 Basal cell carcinoma of skin of other part of trunk: Secondary | ICD-10-CM | POA: Diagnosis not present

## 2019-03-30 DIAGNOSIS — L821 Other seborrheic keratosis: Secondary | ICD-10-CM | POA: Diagnosis not present

## 2019-03-30 DIAGNOSIS — D485 Neoplasm of uncertain behavior of skin: Secondary | ICD-10-CM | POA: Diagnosis not present

## 2019-03-30 DIAGNOSIS — C44712 Basal cell carcinoma of skin of right lower limb, including hip: Secondary | ICD-10-CM | POA: Diagnosis not present

## 2019-04-04 ENCOUNTER — Encounter: Payer: Self-pay | Admitting: Family Medicine

## 2019-04-09 ENCOUNTER — Telehealth: Payer: Self-pay | Admitting: Family Medicine

## 2019-04-09 NOTE — Telephone Encounter (Signed)
Called and spoke with patient and she denies any fever, nasal congestion, cough, and no exposure to [place]. She agreed to come in for appt and was scheduled for tomorrow. Pt agreeable with plan.

## 2019-04-09 NOTE — Telephone Encounter (Signed)
Patient called to say that her ear is not any better. Feels that she is 20,000 feet high in an airplane, and ear will not "pop".   Please contact patient at [phone] to advise.  Thank you Hinton Dyer

## 2019-04-09 NOTE — Telephone Encounter (Signed)
Verify with pt if she has any respiratory symptoms. If she admits to cough, fever, cest or nasal congestion--> she will need to be seen at an UC for her symptoms- sine we are not seeing resp pts in the office.  If the only symptom is her ear is still  Painful--> then she can be scheduled in the office to be seen.

## 2019-04-09 NOTE — Telephone Encounter (Signed)
You stated in last OV note you would want to see her in the office if not better. Please advise.

## 2019-04-10 ENCOUNTER — Ambulatory Visit: Payer: 59 | Admitting: Family Medicine

## 2019-04-10 ENCOUNTER — Other Ambulatory Visit: Payer: Self-pay

## 2019-04-10 ENCOUNTER — Encounter: Payer: Self-pay | Admitting: Family Medicine

## 2019-04-10 ENCOUNTER — Ambulatory Visit (INDEPENDENT_AMBULATORY_CARE_PROVIDER_SITE_OTHER): Payer: 59 | Admitting: Family Medicine

## 2019-04-10 VITALS — BP 117/44 | HR 62 | Temp 97.3°F | Resp 16 | Ht 66.0 in | Wt 148.4 lb

## 2019-04-10 DIAGNOSIS — H9191 Unspecified hearing loss, right ear: Secondary | ICD-10-CM | POA: Diagnosis not present

## 2019-04-10 DIAGNOSIS — H6981 Other specified disorders of Eustachian tube, right ear: Secondary | ICD-10-CM | POA: Diagnosis not present

## 2019-04-10 DIAGNOSIS — H9209 Otalgia, unspecified ear: Secondary | ICD-10-CM

## 2019-04-10 MED ORDER — CETIRIZINE HCL 10 MG PO TABS: 10.0000 mg | ORAL_TABLET | Freq: Every day | ORAL | 11 refills | Status: DC

## 2019-04-10 MED ORDER — FLUTICASONE PROPIONATE 50 MCG/ACT NA SUSP: 2.0000 | Freq: Every day | NASAL | 1 refills | Status: DC

## 2019-04-10 NOTE — Progress Notes (Signed)
Connie Knox , 1961-02-14, 58 y.o., female MRN: [id number] Patient Care Team    Relationship Specialty Notifications Start End  Ma Hillock, DO PCP - General Family Medicine  06/30/18   Juluis Rainier  Optometry  07/03/18   Everlene Farrier, MD Consulting Physician Obstetrics and Gynecology  07/03/18   Richmond Campbell, MD Consulting Physician Gastroenterology  07/03/18     Chief Complaint  Patient presents with  . Ear Pain    R ear pain that has lasted about 2 months      Subjective: Pt presents for an OV with complaints of right ear pain  of 2 months duration. Seen 3 weeks ago and treated as sinus infection given her constellation of other symptoms. Her sinus symptoms have resolved and ear pain has remained. She already suffers from chronic hearing loss in that right  ear. Pt has tried flonase  to ease their symptoms. She is not on an antihistamine pill.  She reports the discomfort as a "pressure "feeling and feels like her ear needs to "pop.  "The pressure is worse at night when she is laying down.  Has started Sudafed the last 2 days.  She is also using an over-the-counter herbal eardrop.  He denies fever, chills, nausea, vomit, nasal congestion, runny nose, cough or shortness of breath.  She denies any ringing of her ears.  Depression screen [facility] 2/9 06/30/2018  Decreased Interest 0  Down, Depressed, Hopeless 0  PHQ - 2 Score 0    Allergies  Allergen Reactions  . Aleve [Naproxen Sodium] Palpitations   Social History   Social History Narrative   Married.  2 children.   Bachelors degree.  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   Drinks alcohol socially.  Former smoker.   Exercises routinely.   Eats dairy products.   Drinks caffeine.   Had smoke alarm in the home.   Wears her seatbelt.   Feels safe in her relationships.   Past Medical History:  Diagnosis Date  . Adenomatous colon polyp   . Arthritis    "knees" (05/12/2016)  . Chicken pox    . Depression   . Dyslipidemia   . GERD (gastroesophageal reflux disease)   . Panic attack   . Pubic ramus fracture ([place]) 05/11/2016   We will down carpeted steps.  Pubic ramus fracture, sacral fracture.   Past Surgical History:  Procedure Laterality Date  . DILATION AND CURETTAGE OF UTERUS  X 9   "all miscarriages"   Family History  Problem Relation Age of Onset  . Lung cancer Maternal Grandmother   . Stroke Maternal Grandfather   . Ulcers Maternal Grandfather   . Breast cancer Paternal Grandmother   . Stomach cancer Paternal Grandfather    Allergies as of 04/10/2019      Reactions   Aleve [naproxen Sodium] Palpitations      Medication List       Accurate as of April 10, 2019 11:59 PM. Always use your most recent med list.        cetirizine 10 MG tablet Commonly known as:  ZYRTEC Take 1 tablet (10 mg total) by mouth daily.   fluticasone 50 MCG/ACT nasal spray Commonly known as:  FLONASE Place 2 sprays into both nostrils daily.   MULTI-VITAMIN DAILY PO Take 1 tablet by mouth daily.   zolpidem 10 MG tablet Commonly known as:  AMBIEN Take 10 mg by mouth at bedtime as needed.  sleep       All past medical history, surgical history, allergies, family history, immunizations andmedications were updated in the EMR today and reviewed under the history and medication portions of their EMR.     ROS: Negative, with the exception of above mentioned in HPI   Objective:  BP (!) 117/44 (BP Location: Left Arm, Patient Position: Sitting, Cuff Size: Normal)   Pulse 62   Temp (!) 97.3 F (36.3 C) (Oral)   Resp 16   Ht 5\' 6"  (1.676 m)   Wt 148 lb 6 oz (67.3 kg)   SpO2 97%   BMI 23.95 kg/m  Body mass index is 23.95 kg/m. Gen: Afebrile. No acute distress. Nontoxic in appearance, well developed, well nourished.  HENT: AT. [place]. Bilateral TM visualized without erythema or bulging.  Mild fluid behind left ear.  Right tympanic membrane dull.  Both tympanic membranes intact.   External auditory canal without erythema, exudates or swelling. MMM, no oral lesions. Bilateral nares without erythema or swelling, no drainage. Throat without erythema or exudates.  No cough.  No hoarseness. Eyes:Pupils Equal Round Reactive to light, Extraocular movements intact,  Conjunctiva without redness, discharge or icterus. Neck/lymp/endocrine: Supple, no lymphadenopathy Skin: no rashes, purpura or petechiae.  Neuro:  Normal gait. PERLA. EOMi. Alert. Oriented x3  No exam data present No results found. No results found for this or any previous visit (from the past 24 hour(s)).  Assessment/Plan: VERNELLE WISNER is a 58 y.o. female present for OV for  Otalgia, unspecified laterality/eustachian tube dysfunction - Dull right TM, otherwise normal Ear exam.  - Galbreath maneuver encouraged.  - Offered steroid injection or oral- she declined.  - continue flonase.  - Use decongestant as tolerated- but would not recommend long term.  - Start oral antihistamine- prescribed zyrtec QHS.  - if still present or worsening will need to refer to ENT. - f/u 4 weeks PRN   Reviewed expectations re: course of current medical issues.  Discussed self-management of symptoms.  Outlined signs and symptoms indicating need for more acute intervention.  Patient verbalized understanding and all questions were answered.  Patient received an After-Visit Summary.    No orders of the defined types were placed in this encounter.    Note is dictated utilizing voice recognition software. Although note has been proof read prior to signing, occasional typographical errors still can be missed. If any questions arise, please do not hesitate to call for verification.   electronically signed by:  Howard Pouch, DO  [place]

## 2019-04-10 NOTE — Patient Instructions (Signed)
Galbreath maneuver a few times a day.  Zyrtec at night- prescribed.  Flonase in day.  Your ear is normal on exam.   If still present in 4 weeks or worsening we could send to ENT.    Eustachian Tube Dysfunction  Eustachian tube dysfunction refers to a condition in which a blockage develops in the narrow passage that connects the middle ear to the back of the nose (eustachian tube). The eustachian tube regulates air pressure in the middle ear by letting air move between the ear and nose. It also helps to drain fluid from the middle ear space. Eustachian tube dysfunction can affect one or both ears. When the eustachian tube does not function properly, air pressure, fluid, or both can build up in the middle ear. What are the causes? This condition occurs when the eustachian tube becomes blocked or cannot open normally. Common causes of this condition include:  Ear infections.  Colds and other infections that affect the nose, mouth, and throat (upper respiratory tract).  Allergies.  Irritation from cigarette smoke.  Irritation from stomach acid coming up into the esophagus (gastroesophageal reflux). The esophagus is the tube that carries food from the mouth to the stomach.  Sudden changes in air pressure, such as from descending in an airplane or scuba diving.  Abnormal growths in the nose or throat, such as: ? Growths that line the nose (nasal polyps). ? Abnormal growth of cells (tumors). ? Enlarged tissue at the back of the throat (adenoids). What increases the risk? You are more likely to develop this condition if:  You smoke.  You are overweight.  You are a child who has: ? Certain birth defects of the mouth, such as cleft palate. ? Large tonsils or adenoids. What are the signs or symptoms? Common symptoms of this condition include:  A feeling of fullness in the ear.  Ear pain.  Clicking or popping noises in the ear.  Ringing in the ear.  Hearing loss.  Loss of  balance.  Dizziness. Symptoms may get worse when the air pressure around you changes, such as when you travel to an area of high elevation, fly on an airplane, or go scuba diving. How is this diagnosed? This condition may be diagnosed based on:  Your symptoms.  A physical exam of your ears, nose, and throat.  Tests, such as those that measure: ? The movement of your eardrum (tympanogram). ? Your hearing (audiometry). How is this treated? Treatment depends on the cause and severity of your condition.  In mild cases, you may relieve your symptoms by moving air into your ears. This is called "popping the ears."  In more severe cases, or if you have symptoms of fluid in your ears, treatment may include: ? Medicines to relieve congestion (decongestants). ? Medicines that treat allergies (antihistamines). ? Nasal sprays or ear drops that contain medicines that reduce swelling (steroids). ? A procedure to drain the fluid in your eardrum (myringotomy). In this procedure, a small tube is placed in the eardrum to:  Drain the fluid.  Restore the air in the middle ear space. ? A procedure to insert a balloon device through the nose to inflate the opening of the eustachian tube (balloon dilation). Follow these instructions at home: Lifestyle  Do not do any of the following until your health care provider approves: ? Travel to high altitudes. ? Fly in airplanes. ? Work in a Pension scheme manager or room. ? Scuba dive.  Do not use any products that  contain nicotine or tobacco, such as cigarettes and e-cigarettes. If you need help quitting, ask your health care provider.  Keep your ears dry. Wear fitted earplugs during showering and bathing. Dry your ears completely after. General instructions  Take over-the-counter and prescription medicines only as told by your health care provider.  Use techniques to help pop your ears as recommended by your health care provider. These may include: ?  Chewing gum. ? Yawning. ? Frequent, forceful swallowing. ? Closing your mouth, holding your nose closed, and gently blowing as if you are trying to blow air out of your nose.  Keep all follow-up visits as told by your health care provider. This is important. Contact a health care provider if:  Your symptoms do not go away after treatment.  Your symptoms come back after treatment.  You are unable to pop your ears.  You have: ? A fever. ? Pain in your ear. ? Pain in your head or neck. ? Fluid draining from your ear.  Your hearing suddenly changes.  You become very dizzy.  You lose your balance. Summary  Eustachian tube dysfunction refers to a condition in which a blockage develops in the eustachian tube.  It can be caused by ear infections, allergies, inhaled irritants, or abnormal growths in the nose or throat.  Symptoms include ear pain, hearing loss, or ringing in the ears.  Mild cases are treated with maneuvers to unblock the ears, such as yawning or ear popping.  Severe cases are treated with medicines. Surgery may also be done (rare). This information is not intended to replace advice given to you by your health care provider. Make sure you discuss any questions you have with your health care provider. Document Released: 01/02/2016 Document Revised: 03/28/2018 Document Reviewed: 03/28/2018 Elsevier Interactive Patient Education  2019 [employer].

## 2019-04-11 ENCOUNTER — Encounter: Payer: Self-pay | Admitting: Family Medicine

## 2019-04-11 DIAGNOSIS — H9191 Unspecified hearing loss, right ear: Secondary | ICD-10-CM | POA: Insufficient documentation

## 2019-08-03 ENCOUNTER — Encounter: Payer: Self-pay | Admitting: Family Medicine

## 2019-08-03 ENCOUNTER — Ambulatory Visit (INDEPENDENT_AMBULATORY_CARE_PROVIDER_SITE_OTHER): Payer: 59 | Admitting: Family Medicine

## 2019-08-03 ENCOUNTER — Other Ambulatory Visit: Payer: Self-pay

## 2019-08-03 ENCOUNTER — Telehealth: Payer: Self-pay

## 2019-08-03 VITALS — HR 67 | Temp 97.7°F

## 2019-08-03 DIAGNOSIS — H9209 Otalgia, unspecified ear: Secondary | ICD-10-CM

## 2019-08-03 MED ORDER — OMEPRAZOLE 40 MG PO CPDR: 40.0000 mg | DELAYED_RELEASE_CAPSULE | Freq: Every day | ORAL | 2 refills | Status: DC

## 2019-08-03 NOTE — Telephone Encounter (Signed)
Pt was called and agreed to 3:15pm VV. Diane please place on schedule.

## 2019-08-03 NOTE — Telephone Encounter (Signed)
Patient has been scheduled

## 2019-08-03 NOTE — Progress Notes (Signed)
VIRTUAL VISIT VIA VIDEO  I connected with Connie Knox on 08/03/19 at  3:15 PM EDT by a video enabled telemedicine application and verified that I am speaking with the correct person using two identifiers. Location patient: Home Location provider: [facility], Office Persons participating in the virtual visit: Patient, Dr. Raoul Pitch and R.Baker, LPN  I discussed the limitations of evaluation and management by telemedicine and the availability of in person appointments. The patient expressed understanding and agreed to proceed.     Connie Knox , 1961/12/07, 58 y.o., female MRN: [id number] Patient Care Team    Relationship Specialty Notifications Start End  Ma Hillock, DO PCP - General Family Medicine  06/30/18   Juluis Rainier  Optometry  07/03/18   Everlene Farrier, MD Consulting Physician Obstetrics and Gynecology  07/03/18   Richmond Campbell, MD Consulting Physician Gastroenterology  07/03/18     Chief Complaint  Patient presents with  . Cough    getting wrose. since 03/2019. tried OTC and not helping. no fever or chills. no congestion. has not been tested for COVID  . Ear Pain    right ear. since 03/2019     Subjective:  Connie Knox is a 58 y.o. female present for right ear pain that has been present since Early April.  Her exam was normal in April of her ear.  We tried daily Zyrtec and Flonase which she did for approximately 3 months without resolution of her symptoms.  She does have a history of reflux and admits to a small tickle cough.  She also has chronic hearing loss out of this area since her mid 10s in which it does not sound like a diagnosis was ever quite established on the cause.  She denies fever, chills, heartburn, teeth pain or sinus pain. Prior note: Pt presents for an OV with complaints of right ear pain  of 2 months duration. Seen 3 weeks ago and treated as sinus infection given her constellation of other symptoms. Her sinus symptoms  have resolved and ear pain has remained. She already suffers from chronic hearing loss in that right  ear. Pt has tried flonase  to ease their symptoms. She is not on an antihistamine pill.  She reports the discomfort as a "pressure "feeling and feels like her ear needs to "pop.  "The pressure is worse at night when she is laying down.  Has started Sudafed the last 2 days.  She is also using an over-the-counter herbal eardrop.  He denies fever, chills, nausea, vomit, nasal congestion, runny nose, cough or shortness of breath.  She denies any ringing of her ears.  Depression screen [facility] 2/9 06/30/2018  Decreased Interest 0  Down, Depressed, Hopeless 0  PHQ - 2 Score 0    Allergies  Allergen Reactions  . Aleve [Naproxen Sodium] Palpitations   Social History   Social History Narrative   Married.  2 children.   Bachelors degree.  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   Drinks alcohol socially.  Former smoker.   Exercises routinely.   Eats dairy products.   Drinks caffeine.   Had smoke alarm in the home.   Wears her seatbelt.   Feels safe in her relationships.   Past Medical History:  Diagnosis Date  . Adenomatous colon polyp   . Arthritis    "knees" (05/12/2016)  . BCC (basal cell carcinoma of skin) 12/2018   BCC mid-chest  and shin  . Chicken pox   . Depression   . Dyslipidemia   . GERD (gastroesophageal reflux disease)   . Hearing loss in right ear    chronic  . Panic attack   . Pubic ramus fracture ([place]) 05/11/2016   fell down carpeted steps.  Pubic ramus fracture, sacral fracture.   Past Surgical History:  Procedure Laterality Date  . DILATION AND CURETTAGE OF UTERUS  X 9   "all miscarriages"   Family History  Problem Relation Age of Onset  . Lung cancer Maternal Grandmother   . Stroke Maternal Grandfather   . Ulcers Maternal Grandfather   . Breast cancer Paternal Grandmother   . Stomach cancer Paternal Grandfather    Allergies as of  08/03/2019      Reactions   Aleve [naproxen Sodium] Palpitations      Medication List       Accurate as of August 03, 2019  3:14 PM. If you have any questions, ask your nurse or doctor.        cetirizine 10 MG tablet Commonly known as: ZYRTEC Take 1 tablet (10 mg total) by mouth daily.   fluticasone 50 MCG/ACT nasal spray Commonly known as: FLONASE Place 2 sprays into both nostrils daily.   MULTI-VITAMIN DAILY PO Take 1 tablet by mouth daily.   zolpidem 10 MG tablet Commonly known as: AMBIEN Take 10 mg by mouth at bedtime as needed. sleep       All past medical history, surgical history, allergies, family history, immunizations andmedications were updated in the EMR today and reviewed under the history and medication portions of their EMR.     ROS: Negative, with the exception of above mentioned in HPI   Objective:  Pulse 67   Temp 97.7 F (36.5 C) (Temporal)   SpO2 96%  There is no height or weight on file to calculate BMI. Gen: Afebrile. No acute distress.  HENT: AT. [place].  MMM.  Eyes:Pupils Equal Round Reactive to light, Extraocular movements intact,  Conjunctiva without redness, discharge or icterus.  Neuro:Alert. Oriented.    No exam data present No results found. No results found for this or any previous visit (from the past 24 hour(s)).  Assessment/Plan: [name] is a 58 y.o. female present for OV for  Otalgia, unspecified laterality/eustachian tube dysfunction - Dull right TM, otherwise normal Ear exam on her visit in April.  She failed trying daily antihistamines and Flonase. -Discussed options with her today to rule out reflux as part of her symptoms by starting her on a PPI for 4 weeks.  She is agreeable to this.  While we refer her to ENT for further evaluation. -Discussed potential of the disorder that created her hearing loss 20 years ago could be playing a role in her pain.  Also can be referred pain to the ear from mouth or neck/throat.  -Start the omeprazole, refer to ENT for further evaluation.   Reviewed expectations re: course of current medical issues.  Discussed self-management of symptoms.  Outlined signs and symptoms indicating need for more acute intervention.  Patient verbalized understanding and all questions were answered.  Patient received an After-Visit Summary.    No orders of the defined types were placed in this encounter.  > 15 minutes spent with patient, > 50% of that time face to face   Note is dictated utilizing voice recognition software. Although note has been proof read prior to signing, occasional typographical errors still can be missed. If  any questions arise, please do not hesitate to call for verification.   electronically signed by:  Howard Pouch, DO  [place]

## 2019-08-03 NOTE — Telephone Encounter (Signed)
Pt called and Left VM stating she continues to have the chronic cough with ear pain. Pt was seen back in April for issue and the cough does seem to be worse or more chronic. Is worse at the end of the day. Pt denies fever, states she did stop allergy medication due to it not working. Pt can do 3:15pm VV.  Please advise

## 2019-08-03 NOTE — Telephone Encounter (Signed)
Approved for 315

## 2019-10-04 DIAGNOSIS — F419 Anxiety disorder, unspecified: Secondary | ICD-10-CM | POA: Insufficient documentation

## 2019-10-04 HISTORY — DX: Anxiety disorder, unspecified: F41.9

## 2019-11-14 ENCOUNTER — Other Ambulatory Visit: Payer: Self-pay

## 2019-11-14 DIAGNOSIS — Z20822 Contact with and (suspected) exposure to covid-19: Secondary | ICD-10-CM

## 2019-11-16 LAB — NOVEL CORONAVIRUS, NAA: SARS-CoV-2, NAA: NOT DETECTED

## 2020-02-28 ENCOUNTER — Ambulatory Visit: Payer: 59 | Attending: Internal Medicine

## 2020-02-28 DIAGNOSIS — Z23 Encounter for immunization: Secondary | ICD-10-CM

## 2020-02-28 NOTE — Progress Notes (Signed)
   Covid-19 Vaccination Clinic  Name:  Connie Knox    MRN: [id number] DOB: 1961/09/20  02/28/2020  Connie Knox was observed post Covid-19 immunization for 15 minutes without incident. She was provided with Vaccine Information Sheet and instruction to access the V-Safe system.   Connie Knox was instructed to call 911 with any severe reactions post vaccine: Marland Kitchen Difficulty breathing  . Swelling of face and throat  . A fast heartbeat  . A bad rash all over body  . Dizziness and weakness   Immunizations Administered    Name Date Dose VIS Date Route   Pfizer COVID-19 Vaccine 02/28/2020  1:15 PM 0.3 mL 11/30/2019 Intramuscular   Manufacturer: [place]   Lot: [id number]   [place]: [id number]

## 2020-03-24 ENCOUNTER — Ambulatory Visit: Payer: 59 | Attending: Internal Medicine

## 2020-03-24 DIAGNOSIS — Z23 Encounter for immunization: Secondary | ICD-10-CM

## 2020-03-24 NOTE — Progress Notes (Signed)
   Covid-19 Vaccination Clinic  Name:  Connie Knox    MRN: [id number] DOB: 02-Aug-1961  03/24/2020  [name] was observed post Covid-19 immunization for 15 minutes without incident. She was provided with Vaccine Information Sheet and instruction to access the V-Safe system.   [name] was instructed to call 911 with any severe reactions post vaccine: Marland Kitchen Difficulty breathing  . Swelling of face and throat  . A fast heartbeat  . A bad rash all over body  . Dizziness and weakness   Immunizations Administered    Name Date Dose VIS Date Route   Pfizer COVID-19 Vaccine 03/24/2020 12:15 PM 0.3 mL 11/30/2019 Intramuscular   Manufacturer: [place]   Lot: [id number]   [place]: [id number]

## 2020-06-27 ENCOUNTER — Telehealth: Payer: Self-pay

## 2020-06-27 NOTE — Telephone Encounter (Signed)
No other Recommendations, Pt will need appt. She can be a VV with another MD since Dr Raoul Pitch is not in the office.

## 2020-06-27 NOTE — Telephone Encounter (Signed)
Patient started with a sore throat 06/19/20. She cannot sleep due to coughing all night. She has been propping her head up & taking OTC medication. Is there anything else she can do?

## 2020-06-30 NOTE — Telephone Encounter (Signed)
Patient Name: Connie Knox Gender: Female DOB: 11/06/1961 Age: 59 Y 29 D Return Phone Number: [phone] (Primary) Address: City/State/Zip: [place] [address] Client [facility] - Client Client Site [place] Physician AA - PHYSICIAN, NOT LISTED- MD Contact Type Call Who Is Calling Patient / Member / Family / Caregiver Call Type Triage / Clinical Relationship To Patient Self Return Phone Number [phone] (Primary) Chief Complaint Sore Throat Reason for Call Symptomatic / Request for Independence states she has sore throat, cough, and congestion. Translation No Nurse Assessment Nurse: Hardin Negus, RN, Mardene Celeste Date/Time [name] Time): 06/27/2020 2:17:28 PM Confirm and document reason for call. If symptomatic, describe symptoms. ---She has sore throat, cough, congestion and does not feel good. She is taking several different meds and nothing is helping. It is not in her chest. Had her vaccinations. She is having no difficulty breathing. She is drinking plenty of fluids Has the patient had close contact with a person known or suspected to have the novel coronavirus illness OR traveled / lives in area with major community spread (including international travel) in the last 14 days from the onset of symptoms? * If Asymptomatic, screen for exposure and travel within the last 14 days. ---No Does the patient have any new or worsening symptoms? ---Yes Will a triage be completed? ---Yes Related visit to physician within the last 2 weeks? ---No Does the PT have any chronic conditions? (i.e. diabetes, asthma, this includes High risk factors for pregnancy, etc.) ---No Is this a behavioral health or substance abuse call? ---No Guidelines Guideline Title Affirmed Question Affirmed Notes Nurse Date/Time [name] Time) Cough - Acute NonProductive Cough with cold symptoms (e.g., runny nose, postnasal  drip, throat clearing) Hardin Negus, RN, Mardene Celeste 06/27/2020 2:22:36 PM Disp. Time [name] Time) Disposition Final User 06/27/2020 2:33:19 PM Home Care Yes Hardin Negus, RN, PatriciaPLEASE NOTE: All timestamps contained within this report are represented as [place] Standard Time. CONFIDENTIALTY NOTICE: This fax transmission is intended only for the addressee. It contains information that is legally privileged, confidential or otherwise protected from use or disclosure. If you are not the intended recipient, you are strictly prohibited from reviewing, disclosing, copying using or disseminating any of this information or taking any action in reliance on or regarding this information. If you have received this fax in error, please notify us immediately by telephone so that we can arrange for its return to [place]. Phone: [phone], Toll-Free: [phone], Fax: [phone] Page: 2 of 2 Call Id: [id number] [place] Disagree/Comply Comply Caller Understands Yes PreDisposition Call Doctor Care Advice Given Per Guideline NASAL WASHES - STEP-BY-STEP INSTRUCTIONS: * Pseudoephedrine (Sudafed): Available over-the-counter in pill form. Typical adult dosage is two 30 mg tablets every 6 hours. CALL BACK IF: * You become worse. CARE ADVICE given per Cough - Acute Non-Productive (Adult) guideline.

## 2020-07-02 ENCOUNTER — Encounter: Payer: Self-pay | Admitting: Family Medicine

## 2020-07-02 ENCOUNTER — Telehealth (INDEPENDENT_AMBULATORY_CARE_PROVIDER_SITE_OTHER): Payer: 59 | Admitting: Family Medicine

## 2020-07-02 ENCOUNTER — Other Ambulatory Visit: Payer: Self-pay

## 2020-07-02 VITALS — Temp 98.3°F | Ht 66.0 in | Wt 151.0 lb

## 2020-07-02 DIAGNOSIS — J0140 Acute pansinusitis, unspecified: Secondary | ICD-10-CM | POA: Diagnosis not present

## 2020-07-02 MED ORDER — AMOXICILLIN-POT CLAVULANATE 875-125 MG PO TABS: 1.0000 | ORAL_TABLET | Freq: Two times a day (BID) | ORAL | 0 refills | Status: DC

## 2020-07-02 NOTE — Progress Notes (Signed)
Virtual Visit via Video Note  I connected with Connie Knox on 07/02/20 at 10:20 AM EDT by a video enabled telemedicine application and verified that I am speaking with the correct person using two identifiers.  Location: Patient: home alone  Provider: home    I discussed the limitations of evaluation and management by telemedicine and the availability of in person appointments. The patient expressed understanding and agreed to proceed.  History of Present Illness:  Pt is at a store and c/o 13 day hx congetion / cough -- she has had a neg covid test.   No fever Using some otc with little relief   Observations/Objective: Vitals:   07/02/20 1018  Temp: 98.3 F (36.8 C)   Pt is in nad  No sob  Assessment and Plan: 1. Acute non-recurrent pansinusitis flonase and antihistamine otc abx per orders rto prn  - amoxicillin-clavulanate (AUGMENTIN) 875-125 MG tablet; Take 1 tablet by mouth 2 (two) times daily.  Dispense: 20 tablet; Refill: 0   Follow Up Instructions:    I discussed the assessment and treatment plan with the patient. The patient was provided an opportunity to ask questions and all were answered. The patient agreed with the plan and demonstrated an understanding of the instructions.   The patient was advised to call back or seek an in-person evaluation if the symptoms worsen or if the condition fails to improve as anticipated.  I provided 30 minutes of non-face-to-face time during this encounter.   Ann Held, DO

## 2020-08-04 ENCOUNTER — Telehealth: Payer: Self-pay

## 2020-08-04 NOTE — Telephone Encounter (Signed)
Patient is requesting Rx for Flonase and "something like sudafed" to be sent [place]

## 2020-08-04 NOTE — Telephone Encounter (Signed)
Patient hasn't been seen in over 1 year.  Pt scheduled VV to discuss allergies 08/05/20

## 2020-08-05 ENCOUNTER — Other Ambulatory Visit: Payer: Self-pay

## 2020-08-05 ENCOUNTER — Telehealth (INDEPENDENT_AMBULATORY_CARE_PROVIDER_SITE_OTHER): Payer: 59 | Admitting: Family Medicine

## 2020-08-05 ENCOUNTER — Encounter: Payer: Self-pay | Admitting: Family Medicine

## 2020-08-05 VITALS — Temp 98.0°F | Wt 154.0 lb

## 2020-08-05 DIAGNOSIS — J302 Other seasonal allergic rhinitis: Secondary | ICD-10-CM

## 2020-08-05 MED ORDER — LEVOCETIRIZINE DIHYDROCHLORIDE 5 MG PO TABS: 5.0000 mg | ORAL_TABLET | Freq: Every evening | ORAL | 3 refills | Status: DC

## 2020-08-05 MED ORDER — AZELASTINE HCL 0.1 % NA SOLN: 2.0000 | Freq: Two times a day (BID) | NASAL | 12 refills | Status: DC

## 2020-08-05 NOTE — Progress Notes (Signed)
VIRTUAL VISIT VIA VIDEO  I connected with Connie Knox on 08/07/20 at  1:30 PM EDT by elemedicine application and verified that I am speaking with the correct person using two identifiers. Location patient: Home Location provider: [facility], Office Persons participating in the virtual visit: Patient, Dr. Raoul Pitch and Toney Reil, [place]  I discussed the limitations of evaluation and management by telemedicine and the availability of in person appointments. The patient expressed understanding and agreed to proceed.   SUBJECTIVE Chief Complaint  Patient presents with  . Sinus issues    sore throat, ear drainage    HPI: Connie Knox is a 59 y.o. present for allergy symptoms. She was seen last year this time for allergies as well.  Patient reports she had an upper respiratory infection about 3 weeks ago.  She was tested for Covid and she was negative.  She has been using Mucinex DM and Sudafed.  She reports she does use Flonase on occasions she does not feel it works all that well.   ROS: See pertinent positives and negatives per HPI.  Patient Active Problem List   Diagnosis Date Noted  . Anxiety 10/04/2019  . Hearing loss in right ear   . Otalgia 03/20/2019  . Adenomatous colon polyp 02/14/2017  . GERD (gastroesophageal reflux disease) 06/11/2011  . Dyslipidemia 06/11/2011    Social History   Tobacco Use  . Smoking status: Former Research scientist (life sciences)  . Smokeless tobacco: Never Used  . Tobacco comment: "smoked cigarettes as a teen"  Substance Use Topics  . Alcohol use: Yes    Alcohol/week: 14.0 standard drinks    Types: 14 Shots of liquor per week    Comment: 05/12/2016 "2 shots vodka/day"    Current Outpatient Medications:  .  Omega-3 Fatty Acids (FISH OIL PO), Take by mouth daily., Disp: , Rfl:  .  VITAMIN D PO, Take by mouth daily., Disp: , Rfl:  .  zolpidem (AMBIEN) 10 MG tablet, Take 10 mg by mouth at bedtime as needed. sleep, Disp: , Rfl: 0 .  azelastine (ASTELIN)  0.1 % nasal spray, Place 2 sprays into both nostrils 2 (two) times daily. Use in each nostril as directed, Disp: 30 mL, Rfl: 12 .  levocetirizine (XYZAL) 5 MG tablet, Take 1 tablet (5 mg total) by mouth every evening., Disp: 90 tablet, Rfl: 3 .  Multiple Vitamin (MULTI-VITAMIN DAILY PO), Take 1 tablet by mouth daily. (Patient not taking: Reported on 08/05/2020), Disp: , Rfl:   Allergies  Allergen Reactions  . Aleve [Naproxen Sodium] Palpitations    OBJECTIVE: Temp 98 F (36.7 C) (Temporal)   Wt 154 lb (69.9 kg)   BMI 24.86 kg/m  Gen: No acute distress. Nontoxic in appearance.  HENT: AT. [place].  MMM.  Eyes:Pupils Equal Round Reactive to light, Extraocular movements intact,  Conjunctiva without redness, discharge or icterus. Chest: Cough or shortness of breath not present.  Neuro: . Alert. Oriented x3  Psych: Normal affect and demeanor. Normal speech. Normal thought content and judgment.  ASSESSMENT AND PLAN: Connie Knox is a 59 y.o. female present for  Seasonal allergies Patient had seasonal allergies around this time last year. Do not recommend routine use of the Sudafed but can use as needed on occasions.  This is something she would purchase over-the-counter. Encouraged her to start Xyzal 5 mg nightly, at least for the next 4 weeks. Continue Flonase Start Astelin Follow-up as needed   Howard Pouch, DO 08/07/2020   Return if symptoms  worsen or fail to improve.  No orders of the defined types were placed in this encounter.  Meds ordered this encounter  Medications  . levocetirizine (XYZAL) 5 MG tablet    Sig: Take 1 tablet (5 mg total) by mouth every evening.    Dispense:  90 tablet    Refill:  3  . azelastine (ASTELIN) 0.1 % nasal spray    Sig: Place 2 sprays into both nostrils 2 (two) times daily. Use in each nostril as directed    Dispense:  30 mL    Refill:  12   Referral Orders  No referral(s) requested today

## 2020-08-07 ENCOUNTER — Encounter: Payer: Self-pay | Admitting: Family Medicine

## 2020-10-03 ENCOUNTER — Encounter (HOSPITAL_COMMUNITY): Payer: Self-pay | Admitting: Emergency Medicine

## 2020-10-03 ENCOUNTER — Emergency Department (HOSPITAL_COMMUNITY): Payer: 59

## 2020-10-03 ENCOUNTER — Emergency Department (HOSPITAL_COMMUNITY)
Admission: EM | Admit: 2020-10-03 | Discharge: 2020-10-04 | Disposition: A | Discharge status: Home / Self Care | Payer: 59 | Attending: Emergency Medicine | Admitting: Emergency Medicine

## 2020-10-03 ENCOUNTER — Other Ambulatory Visit: Payer: Self-pay

## 2020-10-03 DIAGNOSIS — R0789 Other chest pain: Secondary | ICD-10-CM | POA: Insufficient documentation

## 2020-10-03 DIAGNOSIS — Z5321 Procedure and treatment not carried out due to patient leaving prior to being seen by health care provider: Secondary | ICD-10-CM | POA: Diagnosis not present

## 2020-10-03 DIAGNOSIS — R0602 Shortness of breath: Secondary | ICD-10-CM | POA: Insufficient documentation

## 2020-10-03 LAB — BASIC METABOLIC PANEL
Anion gap: 10 (ref 5–15)
BUN: 16 mg/dL (ref 6–20)
CO2: 26 mmol/L (ref 22–32)
Calcium: 9.5 mg/dL (ref 8.9–10.3)
Chloride: 103 mmol/L (ref 98–111)
Creatinine, Ser: 1.08 mg/dL — ABNORMAL HIGH (ref 0.44–1.00)
GFR, Estimated: 56 mL/min — ABNORMAL LOW (ref >60)
Glucose, Bld: 97 mg/dL (ref 70–99)
Potassium: 4 mmol/L (ref 3.5–5.1)
Sodium: 139 mmol/L (ref 135–145)

## 2020-10-03 LAB — CBC
HCT: 40.2 % (ref 36.0–46.0)
Hemoglobin: 12.9 g/dL (ref 12.0–15.0)
MCH: 32.8 pg (ref 26.0–34.0)
MCHC: 32.1 g/dL (ref 30.0–36.0)
MCV: 102.3 fL — ABNORMAL HIGH (ref 80.0–100.0)
Platelets: 305 10*3/uL (ref 150–400)
RBC: 3.93 MIL/uL (ref 3.87–5.11)
RDW: 12.4 % (ref 11.5–15.5)
WBC: 7.8 10*3/uL (ref 4.0–10.5)
nRBC: 0 % (ref 0.0–0.2)

## 2020-10-03 LAB — TROPONIN I (HIGH SENSITIVITY)
Troponin I (High Sensitivity): 2 ng/L (ref <18)
Troponin I (High Sensitivity): 3 ng/L (ref <18)

## 2020-10-03 LAB — PROTIME-INR
INR: 0.9 (ref 0.8–1.2)
Prothrombin Time: 11.9 seconds (ref 11.4–15.2)

## 2020-10-03 NOTE — ED Triage Notes (Signed)
Patient reports central chest pain with SOB onset this morning , pain radiating to left arm , no emesis or diaphoresis , denies cough or fever .

## 2020-10-04 NOTE — ED Notes (Signed)
Pt called x 3  No answer. 

## 2020-10-06 ENCOUNTER — Telehealth: Payer: Self-pay | Admitting: Family Medicine

## 2020-10-06 NOTE — Telephone Encounter (Signed)
Patient states she went to ED on 10/15 due to SOB, pain in upper RT chest. They did tests but patient did not wait to see DR. She wonders if these are side effects of Xyzal allergy meds that Dr. Raoul Pitch recently put her on. Patient states today she has some trouble breathing and is coughing. Patient declined triage.

## 2020-10-06 NOTE — Telephone Encounter (Signed)
Please inform patient: Her symptoms are not likely to be coming from the [place].  She can always attempt to stop the medication and see if her symptoms resolve.  I would recommend she use a different over-the-counter antihistamine if so. I reviewed the chest x-ray she had in the emergency room it was normal.  Her kidney function had a mild decrease, which typically is secondary to dehydration when it is that mild of a change.  Her troponins, which rule out heart attack, were normal.  I would encourage her to hydrate adequately prior to her upcoming appointment with [place]  and hopefully kidney function returns to normal.

## 2020-10-06 NOTE — Telephone Encounter (Signed)
Pt is not having SOB at the time but is concerned because she feels like her breathing is not right. I have scheduled pt for VV on 10/25 at 8 am due to her cough. She is also concerned about kidney function/failure because of labs that she viewed on MyChart.

## 2020-10-06 NOTE — Telephone Encounter (Signed)
Spoke with pt regarding instructions below. Pt appt changed to in office

## 2020-10-13 ENCOUNTER — Encounter: Payer: Self-pay | Admitting: Family Medicine

## 2020-10-13 ENCOUNTER — Ambulatory Visit: Payer: 59 | Admitting: Family Medicine

## 2020-10-13 ENCOUNTER — Other Ambulatory Visit: Payer: Self-pay

## 2020-10-13 VITALS — BP 113/72 | HR 65 | Temp 98.0°F | Ht 66.0 in | Wt 160.0 lb

## 2020-10-13 DIAGNOSIS — K219 Gastro-esophageal reflux disease without esophagitis: Secondary | ICD-10-CM | POA: Diagnosis not present

## 2020-10-13 DIAGNOSIS — R944 Abnormal results of kidney function studies: Secondary | ICD-10-CM

## 2020-10-13 MED ORDER — PANTOPRAZOLE SODIUM 40 MG PO TBEC: 40.0000 mg | DELAYED_RELEASE_TABLET | Freq: Every day | ORAL | 3 refills | Status: DC

## 2020-10-13 NOTE — Progress Notes (Signed)
This visit occurred during the SARS-CoV-2 public health emergency.  Safety protocols were in place, including screening questions prior to the visit, additional usage of staff PPE, and extensive cleaning of exam room while observing appropriate contact time as indicated for disinfecting solutions.    Wilford Grist , 17-Jun-1961, 59 y.o., female MRN: [id number] Patient Care Team    Relationship Specialty Notifications Start End  Ma Hillock, DO PCP - General Family Medicine  06/30/18   Juluis Rainier  Optometry  07/03/18   Everlene Farrier, MD Consulting Physician Obstetrics and Gynecology  07/03/18   Richmond Campbell, MD Consulting Physician Gastroenterology  07/03/18     Chief Complaint  Patient presents with  . Follow-up    pt has concerns in regards Xyzal; pt c/o a burning sensation in throat when laying down but relieved by tums as well as a cough x 1 year     Subjective: Pt presents for an OV with complaints of cough and GERD like symptoms. She presented to ED 10/03/2020 and had cxr w/ labs- It does not appear she stayed to get speak to doctor. Trop negative. CXR normal. CBC with elevated MCV otherwise normal and Creatinine mildly elevated 1.08. Today she reports concerns over xyzal use and kidney fx. She reports burning sensation when laying flat and cough present > 1 yr.   In ED she has right sided chest discomfort and pain w/ deep breaths. Pain lasted about 3 days. She went to the beach that weekend.   CXR: IMPRESSION: No evidence of active cardiopulmonary disease.  Depression screen [facility] 2/9 10/13/2020 06/30/2018  Decreased Interest 0 0  Down, Depressed, Hopeless 0 0  PHQ - 2 Score 0 0    Allergies  Allergen Reactions  . Aleve [Naproxen Sodium] Palpitations   Social History   Social History Narrative   Married.  2 children.   Bachelors degree.  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   Drinks alcohol socially.  Former smoker.    Exercises routinely.   Eats dairy products.   Drinks caffeine.   Had smoke alarm in the home.   Wears her seatbelt.   Feels safe in her relationships.   Past Medical History:  Diagnosis Date  . Adenomatous colon polyp   . Arthritis    "knees" (05/12/2016)  . BCC (basal cell carcinoma of skin) 12/2018   BCC mid-chest and shin  . Chicken pox   . Depression   . Dyslipidemia   . GERD (gastroesophageal reflux disease)   . Hearing loss in right ear    chronic  . Panic attack   . Pubic ramus fracture ([place]) 05/11/2016   fell down carpeted steps.  Pubic ramus fracture, sacral fracture.   Past Surgical History:  Procedure Laterality Date  . DILATION AND CURETTAGE OF UTERUS  X 9   "all miscarriages"   Family History  Problem Relation Age of Onset  . Lung cancer Maternal Grandmother   . Stroke Maternal Grandfather   . Ulcers Maternal Grandfather   . Breast cancer Paternal Grandmother   . Stomach cancer Paternal Grandfather    Allergies as of 10/13/2020      Reactions   Aleve [naproxen Sodium] Palpitations      Medication List       Accurate as of October 13, 2020  8:33 AM. If you have any questions, ask your nurse or doctor.        azelastine  0.1 % nasal spray Commonly known as: ASTELIN Place 2 sprays into both nostrils 2 (two) times daily. Use in each nostril as directed   FISH OIL PO Take by mouth daily.   levocetirizine 5 MG tablet Commonly known as: Xyzal Take 1 tablet (5 mg total) by mouth every evening.   MULTI-VITAMIN DAILY PO Take 1 tablet by mouth daily.   pantoprazole 40 MG tablet Commonly known as: PROTONIX Take 1 tablet (40 mg total) by mouth daily. Started by: Howard Pouch, DO   VITAMIN D PO Take by mouth daily.   zolpidem 10 MG tablet Commonly known as: AMBIEN Take 10 mg by mouth at bedtime as needed. sleep       All past medical history, surgical history, allergies, family history, immunizations andmedications were updated in the EMR  today and reviewed under the history and medication portions of their EMR.     ROS: Negative, with the exception of above mentioned in HPI   Objective:  BP 113/72   Pulse 65   Temp 98 F (36.7 C) (Oral)   Ht 5\' 6"  (1.676 m)   Wt 160 lb (72.6 kg)   SpO2 98%   BMI 25.82 kg/m  Body mass index is 25.82 kg/m. Gen: Afebrile. No acute distress. Nontoxic in appearance, well developed, well nourished.  HENT: AT. [place]. Bilateral TM visualized WNL- mild allergy signs left. MMM, no oral lesions. Bilateral nares WNL. Throat without erythema or exudates. Mild cough. No hoarseness.  Eyes:Pupils Equal Round Reactive to light, Extraocular movements intact,  Conjunctiva without redness, discharge or icterus. Neck/lymp/endocrine: Supple,no lymphadenopathy CV: RRR no murmur, noedema Chest: CTAB, no wheeze or crackles. Good air movement, normal resp effort.  Neuro:  Normal gait. PERLA. EOMi. Alert. Oriented x3 Psych: Normal affect, dress and demeanor. Normal speech. Normal thought content and judgment.  No exam data present No results found. No results found for this or any previous visit (from the past 24 hour(s)).  Assessment/Plan: DARON BREEDING is a 59 y.o. female present for OV for  Decreased GFR Pt will work on hydration. GFR intermittently high 50s since 2015.  She declined rpt BMP today.   Gastroesophageal reflux disease without esophagitis Sx are consistent with reflux sx.  She was educated on avoiding meals 3-4 hours prior to laying flat Avoid triggers.  Continue a antihistamine of choice.  Start protonix qhs for 2 weeks > if improvement continue for 3 months and then attempt to dc.  If symptoms are not improved > consider GI referral.      Reviewed expectations re: course of current medical issues.  Discussed self-management of symptoms.  Outlined signs and symptoms indicating need for more acute intervention.  Patient verbalized understanding and all questions were  answered.  Patient received an After-Visit Summary.    No orders of the defined types were placed in this encounter.  Meds ordered this encounter  Medications  . pantoprazole (PROTONIX) 40 MG tablet    Sig: Take 1 tablet (40 mg total) by mouth daily.    Dispense:  30 tablet    Refill:  3   Referral Orders  No referral(s) requested today     Note is dictated utilizing voice recognition software. Although note has been proof read prior to signing, occasional typographical errors still can be missed. If any questions arise, please do not hesitate to call for verification.   electronically signed by:  Howard Pouch, DO  [place]

## 2020-10-13 NOTE — Patient Instructions (Addendum)
Continue antihistamine.  Continue flonase.  Start protonix.   Drink at least 80 ounces of water a day.    Food Choices for Gastroesophageal Reflux Disease, Adult When you have gastroesophageal reflux disease (GERD), the foods you eat and your eating habits are very important. Choosing the right foods can help ease your discomfort. Think about working with a nutrition specialist (dietitian) to help you make good choices. What are tips for following this plan?  Meals  Choose healthy foods that are low in fat, such as fruits, vegetables, whole grains, low-fat dairy products, and lean meat, fish, and poultry.  Eat small meals often instead of 3 large meals a day. Eat your meals slowly, and in a place where you are relaxed. Avoid bending over or lying down until 2-3 hours after eating.  Avoid eating meals 2-3 hours before bed.  Avoid drinking a lot of liquid with meals.  Cook foods using methods other than frying. Bake, grill, or broil food instead.  Avoid or limit: ? Chocolate. ? Peppermint or spearmint. ? Alcohol. ? Pepper. ? Black and decaffeinated coffee. ? Black and decaffeinated tea. ? Bubbly (carbonated) soft drinks. ? Caffeinated energy drinks and soft drinks.  Limit high-fat foods such as: ? Fatty meat or fried foods. ? Whole milk, cream, butter, or ice cream. ? Nuts and nut butters. ? Pastries, donuts, and sweets made with butter or shortening.  Avoid foods that cause symptoms. These foods may be different for everyone. Common foods that cause symptoms include: ? Tomatoes. ? Oranges, lemons, and limes. ? Peppers. ? Spicy food. ? Onions and garlic. ? Vinegar. Lifestyle  Maintain a healthy weight. Ask your doctor what weight is healthy for you. If you need to lose weight, work with your doctor to do so safely.  Exercise for at least 30 minutes for 5 or more days each week, or as told by your doctor.  Wear loose-fitting clothes.  Do not smoke. If you need help  quitting, ask your doctor.  Sleep with the head of your bed higher than your feet. Use a wedge under the mattress or blocks under the bed frame to raise the head of the bed. Summary  When you have gastroesophageal reflux disease (GERD), food and lifestyle choices are very important in easing your symptoms.  Eat small meals often instead of 3 large meals a day. Eat your meals slowly, and in a place where you are relaxed.  Limit high-fat foods such as fatty meat or fried foods.  Avoid bending over or lying down until 2-3 hours after eating.  Avoid peppermint and spearmint, caffeine, alcohol, and chocolate. This information is not intended to replace advice given to you by your health care provider. Make sure you discuss any questions you have with your health care provider. Document Revised: 03/29/2019 Document Reviewed: 01/11/2017 Elsevier Patient Education  [place].

## 2020-11-27 DIAGNOSIS — M8580 Other specified disorders of bone density and structure, unspecified site: Secondary | ICD-10-CM

## 2020-11-27 HISTORY — DX: Other specified disorders of bone density and structure, unspecified site: M85.80

## 2021-01-12 ENCOUNTER — Encounter: Payer: Self-pay | Admitting: Family Medicine

## 2021-01-12 ENCOUNTER — Ambulatory Visit (INDEPENDENT_AMBULATORY_CARE_PROVIDER_SITE_OTHER): Payer: 59 | Admitting: Family Medicine

## 2021-01-12 ENCOUNTER — Other Ambulatory Visit: Payer: Self-pay

## 2021-01-12 VITALS — BP 136/84 | HR 50 | Temp 97.5°F | Resp 18 | Ht 66.0 in | Wt 165.2 lb

## 2021-01-12 DIAGNOSIS — M19049 Primary osteoarthritis, unspecified hand: Secondary | ICD-10-CM

## 2021-01-12 DIAGNOSIS — Z131 Encounter for screening for diabetes mellitus: Secondary | ICD-10-CM

## 2021-01-12 DIAGNOSIS — Z23 Encounter for immunization: Secondary | ICD-10-CM

## 2021-01-12 DIAGNOSIS — Z1159 Encounter for screening for other viral diseases: Secondary | ICD-10-CM

## 2021-01-12 DIAGNOSIS — E785 Hyperlipidemia, unspecified: Secondary | ICD-10-CM

## 2021-01-12 DIAGNOSIS — Z0000 Encounter for general adult medical examination without abnormal findings: Secondary | ICD-10-CM | POA: Diagnosis not present

## 2021-01-12 DIAGNOSIS — N1831 Chronic kidney disease, stage 3a: Secondary | ICD-10-CM | POA: Diagnosis not present

## 2021-01-12 DIAGNOSIS — D126 Benign neoplasm of colon, unspecified: Secondary | ICD-10-CM | POA: Diagnosis not present

## 2021-01-12 DIAGNOSIS — K219 Gastro-esophageal reflux disease without esophagitis: Secondary | ICD-10-CM | POA: Diagnosis not present

## 2021-01-12 HISTORY — DX: Primary osteoarthritis, unspecified hand: M19.049

## 2021-01-12 MED ORDER — PANTOPRAZOLE SODIUM 40 MG PO TBEC
40.0000 mg | DELAYED_RELEASE_TABLET | Freq: Every day | ORAL | 3 refills | Status: DC
Start: 2021-01-12 — End: 2022-02-02

## 2021-01-12 MED ORDER — FAMOTIDINE 20 MG PO TABS: 20.0000 mg | ORAL_TABLET | Freq: Two times a day (BID) | ORAL | 5 refills | Status: DC

## 2021-01-12 NOTE — Progress Notes (Signed)
This visit occurred during the SARS-CoV-2 public health emergency.  Safety protocols were in place, including screening questions prior to the visit, additional usage of staff PPE, and extensive cleaning of exam room while observing appropriate contact time as indicated for disinfecting solutions.    Patient ID: Connie Knox, female  DOB: 05-Jan-1961, 60 y.o.   MRN: [id number] Patient Care Team    Relationship Specialty Notifications Start End  Ma Hillock, DO PCP - General Family Medicine  06/30/18   Juluis Rainier  Optometry  07/03/18   Everlene Farrier, MD Consulting Physician Obstetrics and Gynecology  07/03/18   Richmond Campbell, MD Consulting Physician Gastroenterology  07/03/18     Chief Complaint  Patient presents with  . Annual Exam    Pap and Mammogram: sees GYN-  [facility] Tdap: thinks she has had with in 10 years.  GERD: is back, she thinks her medication needs to be altered. Morning and evening are worse x 4 weeks now.     Subjective: Connie Knox is a 60 y.o.  Female  present for CPE. All past medical history, surgical history, allergies, family history, immunizations, medications and social history were updated in the electronic medical record today. All recent labs, ED visits and hospitalizations within the last year were reviewed.  Health maintenance:  Colonoscopy: completed by Dr. Earlean Shawl, colon polyps.  Repeat due in 2023, however she was having some GI problems and they are moving forward with early colonoscopy. Mammogram/cervical cancer screening: completed: By gynecology.  Records requested Immunizations: tdap completed today, Influenza declined (encouraged yearly), shingrix -nurse appointment in 1 to 2 weeks for Shingrix No. 1 and another nurse appointment 3 months thereafter for Shingrix No. 2.  Covid vaccines completed with booster. Infectious disease screening: HIV completed, Hep C completed today DEXA: Patient reports she had her DEXA  completed this past year with her gynecology. Assistive device: None Oxygen use: None Patient has a Dental home. Hospitalizations/ED visits: Reviewed  GERD: Patient reports her symptoms greatly improved with Protonix 40 mg daily.  She now feels that it may be wearing off around 10:00 at night.  She does have an upcoming gastroenterology appointment.  Bilateral hand pain: Patient reports worsening bilateral hand pain of a few months duration.  She reports multiple joints within her fingers hands and wrists feel tight and have discomfort.  She denies any redness or swelling of her joints.  She is not taking anything over-the-counter for her discomfort.  There is no family history of inflammatory or rheumatoid arthritis.  Patient has no prior history of inflammatory or rheumatoid arthritis.  She does have osteoarthritis bilateral knees.  Depression screen [facility] 2/9 10/13/2020 06/30/2018  Decreased Interest 0 0  Down, Depressed, Hopeless 0 0  PHQ - 2 Score 0 0   No flowsheet data found.   Immunization History  Administered Date(s) Administered  . PFIZER(Purple Top)SARS-COV-2 Vaccination 02/28/2020, 03/24/2020, 10/10/2020    Past Medical History:  Diagnosis Date  . Adenomatous colon polyp   . Arthritis    "knees" (05/12/2016)  . BCC (basal cell carcinoma of skin) 12/2018   BCC mid-chest and shin  . Chicken pox   . Depression   . Dyslipidemia   . GERD (gastroesophageal reflux disease)   . Hearing loss in right ear    chronic  . Panic attack   . Pubic ramus fracture ([place]) 05/11/2016   fell down carpeted steps.  Pubic ramus fracture, sacral fracture.   Allergies  Allergen  Reactions  . Aleve [Naproxen Sodium] Palpitations   Past Surgical History:  Procedure Laterality Date  . DILATION AND CURETTAGE OF UTERUS  X 9   "all miscarriages"   Family History  Problem Relation Age of Onset  . Lung cancer Maternal Grandmother   . Stroke Maternal Grandfather   . Ulcers Maternal  Grandfather   . Breast cancer Paternal Grandmother   . Stomach cancer Paternal Grandfather    Social History   Social History Narrative   Married.  2 children.   Bachelors degree.  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   Drinks alcohol socially.  Former smoker.   Exercises routinely.   Eats dairy products.   Drinks caffeine.   Had smoke alarm in the home.   Wears her seatbelt.   Feels safe in her relationships.    Allergies as of 01/12/2021      Reactions   Aleve [naproxen Sodium] Palpitations      Medication List       Accurate as of January 12, 2021  1:51 PM. If you have any questions, ask your nurse or doctor.        STOP taking these medications   levocetirizine 5 MG tablet Commonly known as: Xyzal Stopped by: Howard Pouch, DO     TAKE these medications   azelastine 0.1 % nasal spray Commonly known as: ASTELIN Place 2 sprays into both nostrils 2 (two) times daily. Use in each nostril as directed   famotidine 20 MG tablet Commonly known as: Pepcid Take 1 tablet (20 mg total) by mouth 2 (two) times daily. Started by: Howard Pouch, DO   FISH OIL PO Take by mouth daily.   MULTI-VITAMIN DAILY PO Take 1 tablet by mouth daily.   pantoprazole 40 MG tablet Commonly known as: PROTONIX Take 1 tablet (40 mg total) by mouth daily.   sertraline 25 MG tablet Commonly known as: ZOLOFT Take by mouth.   VITAMIN D PO Take by mouth daily.   zolpidem 10 MG tablet Commonly known as: AMBIEN Take 10 mg by mouth at bedtime as needed. sleep       All past medical history, surgical history, allergies, family history, immunizations andmedications were updated in the EMR today and reviewed under the history and medication portions of their EMR.     No results found for this or any previous visit (from the past 2160 hour(s)).    ROS: 14 pt review of systems performed and negative (unless mentioned in an HPI)  Objective: BP 136/84 (BP  Location: Left Arm, Patient Position: Sitting, Cuff Size: Normal)   Pulse (!) 50   Temp (!) 97.5 F (36.4 C) (Oral)   Resp 18   Ht 5\' 6"  (1.676 m)   Wt 165 lb 3.2 oz (74.9 kg)   SpO2 98%   BMI 26.66 kg/m  Gen: Afebrile. No acute distress. Nontoxic in appearance, well-developed, well-nourished, pleasant female, mildly overweight. HENT: AT. [place]. Bilateral TM visualized and normal in appearance-mild bilateral clear effusion, normal external auditory canal. MMM, no oral lesions, adequate dentition. Bilateral nares within normal limits. Throat without erythema, ulcerations or exudates.  No cough on exam, no hoarseness on exam. Eyes:Pupils Equal Round Reactive to light, Extraocular movements intact,  Conjunctiva without redness, discharge or icterus. Neck/lymp/endocrine: Supple, no lymphadenopathy, no thyromegaly CV: RRR no murmur, no edema, +2/4 P posterior tibialis pulses.  Chest: CTAB, no wheeze, rhonchi or crackles.  Normal respiratory effort.  Good air  movement. Abd: Soft.  Flat. NTND. BS present.  No masses palpated. No hepatosplenomegaly. No rebound tenderness or guarding. Skin: No rashes, purpura or petechiae. Warm and well-perfused. Skin intact. Neuro/Msk:  Normal gait. PERLA. EOMi. Alert. Oriented x3.  Cranial nerves II through XII intact. Muscle strength 5/5 upper/lower extremity. DTRs equal bilaterally. Psych: Normal affect, dress and demeanor. Normal speech. Normal thought content and judgment.  No exam data present  Assessment/plan: Connie Knox is a 60 y.o. female present for CPE/CMC CKD stage IIIa Pt will work on hydration. GFR intermittently high 50s since 2015.  CMP collected today  Dyslipidemia Currently taking fish oil supplementation only. Encouraged heart healthy diet and routine exercise - Basic Metabolic Panel (BMET) - TSH - Lipid panel  Adenomatous polyp of colon, unspecified part of colon Followed by GI upcoming colonoscopy scheduled  Screening for  diabetes mellitus - Hemoglobin A1c  Need for hepatitis C screening test - Hepatitis C Antibody  Need for Tdap vaccination - Tdap vaccine greater than or equal to 7yo IM  Gastroesophageal reflux disease without esophagitis She did see improvement in her symptoms with Protonix.  However she noticed over the last couple weeks it is not working as well but still effective. Continue Protonix daily Start Pepcid twice daily Avoid any known triggers. Encouraged her to follow-up with her GI for her reflux since it is not as well controlled and she has upcoming appointment.  Bilateral hand osteoarthritis: Bilateral hand discomfort likely osteoarthritis. Encouraged her to use extra strength Tylenol nightly Could try turmeric supplementation as well. Would try to avoid anti-inflammatories if possible secondary to worsening reflux. Patient reports understanding  Encounter for preventative health exam: Patient was encouraged to exercise greater than 150 minutes a week. Patient was encouraged to choose a diet filled with fresh fruits and vegetables, and lean meats. AVS provided to patient today for education/recommendation on gender specific health and safety maintenance. Colonoscopy: completed by Dr. Earlean Shawl, colon polyps.  Repeat due in 2023, however she was having some GI problems and they are moving forward with early colonoscopy. Mammogram/cervical cancer screening: completed: By gynecology.  Records requested Immunizations: tdap completed today, Influenza declined (encouraged yearly), shingrix -nurse appointment in 1 to 2 weeks for Shingrix No. 1 and another nurse appointment 3 months thereafter for Shingrix No. 2.  Covid vaccines completed with booster. Infectious disease screening: HIV completed, Hep C completed today DEXA: Patient reports she had her DEXA completed this past year with her gynecology.  Return in about 1 year (around 01/12/2022) for CPE (30 min).  Orders Placed This Encounter   Procedures  . Basic Metabolic Panel (BMET)  . Hemoglobin A1c  . TSH  . Lipid panel  . Hepatitis C Antibody    Meds ordered this encounter  Medications  . pantoprazole (PROTONIX) 40 MG tablet    Sig: Take 1 tablet (40 mg total) by mouth daily.    Dispense:  90 tablet    Refill:  3  . famotidine (PEPCID) 20 MG tablet    Sig: Take 1 tablet (20 mg total) by mouth 2 (two) times daily.    Dispense:  60 tablet    Refill:  5   Referral Orders  No referral(s) requested today     Electronically signed by: Howard Pouch, [place]

## 2021-01-12 NOTE — Patient Instructions (Signed)
Health Maintenance, Female Adopting a healthy lifestyle and getting preventive care are important in promoting health and wellness. Ask your health care provider about:  The right schedule for you to have regular tests and exams.  Things you can do on your own to prevent diseases and keep yourself healthy. What should I know about diet, weight, and exercise? Eat a healthy diet  Eat a diet that includes plenty of vegetables, fruits, low-fat dairy products, and lean protein.  Do not eat a lot of foods that are high in solid fats, added sugars, or sodium.   Maintain a healthy weight Body mass index (BMI) is used to identify weight problems. It estimates body fat based on height and weight. Your health care provider can help determine your BMI and help you achieve or maintain a healthy weight. Get regular exercise Get regular exercise. This is one of the most important things you can do for your health. Most adults should:  Exercise for at least 150 minutes each week. The exercise should increase your heart rate and make you sweat (moderate-intensity exercise).  Do strengthening exercises at least twice a week. This is in addition to the moderate-intensity exercise.  Spend less time sitting. Even light physical activity can be beneficial. Watch cholesterol and blood lipids Have your blood tested for lipids and cholesterol at 60 years of age, then have this test every 5 years. Have your cholesterol levels checked more often if:  Your lipid or cholesterol levels are high.  You are older than 60 years of age.  You are at high risk for heart disease. What should I know about cancer screening? Depending on your health history and family history, you may need to have cancer screening at various ages. This may include screening for:  Breast cancer.  Cervical cancer.  Colorectal cancer.  Skin cancer.  Lung cancer. What should I know about heart disease, diabetes, and high blood  pressure? Blood pressure and heart disease  High blood pressure causes heart disease and increases the risk of stroke. This is more likely to develop in people who have high blood pressure readings, are of African descent, or are overweight.  Have your blood pressure checked: ? Every 3-5 years if you are 18-39 years of age. ? Every year if you are 40 years old or older. Diabetes Have regular diabetes screenings. This checks your fasting blood sugar level. Have the screening done:  Once every three years after age 40 if you are at a normal weight and have a low risk for diabetes.  More often and at a younger age if you are overweight or have a high risk for diabetes. What should I know about preventing infection? Hepatitis B If you have a higher risk for hepatitis B, you should be screened for this virus. Talk with your health care provider to find out if you are at risk for hepatitis B infection. Hepatitis C Testing is recommended for:  Everyone born from 1945 through 1965.  Anyone with known risk factors for hepatitis C. Sexually transmitted infections (STIs)  Get screened for STIs, including gonorrhea and chlamydia, if: ? You are sexually active and are younger than 60 years of age. ? You are older than 60 years of age and your health care provider tells you that you are at risk for this type of infection. ? Your sexual activity has changed since you were last screened, and you are at increased risk for chlamydia or gonorrhea. Ask your health care provider   if you are at risk.  Ask your health care provider about whether you are at high risk for HIV. Your health care provider may recommend a prescription medicine to help prevent HIV infection. If you choose to take medicine to prevent HIV, you should first get tested for HIV. You should then be tested every 3 months for as long as you are taking the medicine. Pregnancy  If you are about to stop having your period (premenopausal) and  you may become pregnant, seek counseling before you get pregnant.  Take 400 to 800 micrograms (mcg) of folic acid every day if you become pregnant.  Ask for birth control (contraception) if you want to prevent pregnancy. Osteoporosis and menopause Osteoporosis is a disease in which the bones lose minerals and strength with aging. This can result in bone fractures. If you are 65 years old or older, or if you are at risk for osteoporosis and fractures, ask your health care provider if you should:  Be screened for bone loss.  Take a calcium or vitamin D supplement to lower your risk of fractures.  Be given hormone replacement therapy (HRT) to treat symptoms of menopause. Follow these instructions at home: Lifestyle  Do not use any products that contain nicotine or tobacco, such as cigarettes, e-cigarettes, and chewing tobacco. If you need help quitting, ask your health care provider.  Do not use street drugs.  Do not share needles.  Ask your health care provider for help if you need support or information about quitting drugs. Alcohol use  Do not drink alcohol if: ? Your health care provider tells you not to drink. ? You are pregnant, may be pregnant, or are planning to become pregnant.  If you drink alcohol: ? Limit how much you use to 0-1 drink a day. ? Limit intake if you are breastfeeding.  Be aware of how much alcohol is in your drink. In the [place] one drink equals one 12 oz bottle of beer (355 mL), one 5 oz glass of wine (148 mL), or one 1 oz glass of hard liquor (44 mL). General instructions  Schedule regular health, dental, and eye exams.  Stay current with your vaccines.  Tell your health care provider if: ? You often feel depressed. ? You have ever been abused or do not feel safe at home. Summary  Adopting a healthy lifestyle and getting preventive care are important in promoting health and wellness.  Follow your health care provider's instructions about healthy  diet, exercising, and getting tested or screened for diseases.  Follow your health care provider's instructions on monitoring your cholesterol and blood pressure. This information is not intended to replace advice given to you by your health care provider. Make sure you discuss any questions you have with your health care provider. Document Revised: 11/29/2018 Document Reviewed: 11/29/2018 Elsevier Patient Education  2021 Elsevier Inc.  

## 2021-01-13 ENCOUNTER — Telehealth: Payer: Self-pay | Admitting: Family Medicine

## 2021-01-13 LAB — BASIC METABOLIC PANEL
BUN: 14 mg/dL (ref 7–25)
CO2: 24 mmol/L (ref 20–32)
Calcium: 9.7 mg/dL (ref 8.6–10.4)
Chloride: 102 mmol/L (ref 98–110)
Creat: 0.96 mg/dL (ref 0.50–1.05)
Glucose, Bld: 87 mg/dL (ref 65–99)
Potassium: 4.6 mmol/L (ref 3.5–5.3)
Sodium: 140 mmol/L (ref 135–146)

## 2021-01-13 LAB — LIPID PANEL
Cholesterol: 262 mg/dL — ABNORMAL HIGH (ref <200)
HDL: 88 mg/dL (ref >=50)
LDL Cholesterol (Calc): 158 mg/dL (calc) — ABNORMAL HIGH
Non-HDL Cholesterol (Calc): 174 mg/dL (calc) — ABNORMAL HIGH (ref <130)
Total CHOL/HDL Ratio: 3 (calc) (ref <5.0)
Triglycerides: 63 mg/dL (ref <150)

## 2021-01-13 LAB — HEPATITIS C ANTIBODY
Hepatitis C Ab: NONREACTIVE
SIGNAL TO CUT-OFF: 0.01 (ref <1.00)

## 2021-01-13 LAB — HEMOGLOBIN A1C
Hgb A1c MFr Bld: 5.3 % of total Hgb (ref <5.7)
Mean Plasma Glucose: 105 mg/dL
eAG (mmol/L): 5.8 mmol/L

## 2021-01-13 LAB — TSH: TSH: 3.11 mIU/L (ref 0.40–4.50)

## 2021-01-13 NOTE — Telephone Encounter (Signed)
Please inform patient: Her labs are all normal with the exception of her cholesterol panel. Her good cholesterol/HDL looks excellent at 88.  Her triglycerides are excellent at 63.  Her LDL/bad cholesterol is 158-which is borderline high.  -Overall the cholesterol panel looks great, however she would benefit if we could get her LDL down at least below 130 given her family history of stroke.  Since her LDL is still below 160, we have options.   -We could start a statin medication low-dose, to help her bring down her LDL and provide her cardiovascular protection from heart attack and stroke. Or -She could work on increasing her exercise to at least 150 minutes a week of cardiovascular exercise and follow a heart healthy low saturated fat diet.  Avoid butters and red meats.  Use olive oil or canola oil.  Choose lean meats without skin and baked.  Increasing fiber in her diet.  And rechecking her cholesterol in 6 months to see if she is at goal.  Of course even if she chooses the statin medication I would encourage her to make the dietary and exercise modifications mentioned above.   Advise if she would like to start statin now.  If she starts a statin medication, we would follow-up in 3 months provider appointment to ensure she is at goal. If she elects not to start statin medication at this time please schedule her for follow-up in 6 months with provider.   -Hepatitis C screening is still pending.  We will send her and E chart message with results if negative and call her if further intervention is needed.

## 2021-01-13 NOTE — Telephone Encounter (Signed)
LVM for pt to CB regarding results.  

## 2021-01-13 NOTE — Telephone Encounter (Signed)
MyChart message sent with results.  MyChart message read.

## 2021-01-16 ENCOUNTER — Ambulatory Visit (INDEPENDENT_AMBULATORY_CARE_PROVIDER_SITE_OTHER): Payer: 59

## 2021-01-16 ENCOUNTER — Other Ambulatory Visit: Payer: Self-pay

## 2021-01-16 DIAGNOSIS — Z23 Encounter for immunization: Secondary | ICD-10-CM | POA: Diagnosis not present

## 2021-01-24 IMAGING — CR DG CHEST 2V
2 series · 2 of 2 positions shown · non-contrast
Comparison: CT angiogram chest 06/13/2014. Chest radiographs
06/13/2014.

CLINICAL DATA: Chest pain.

EXAM:
CHEST - 2 VIEW

[chest pa]
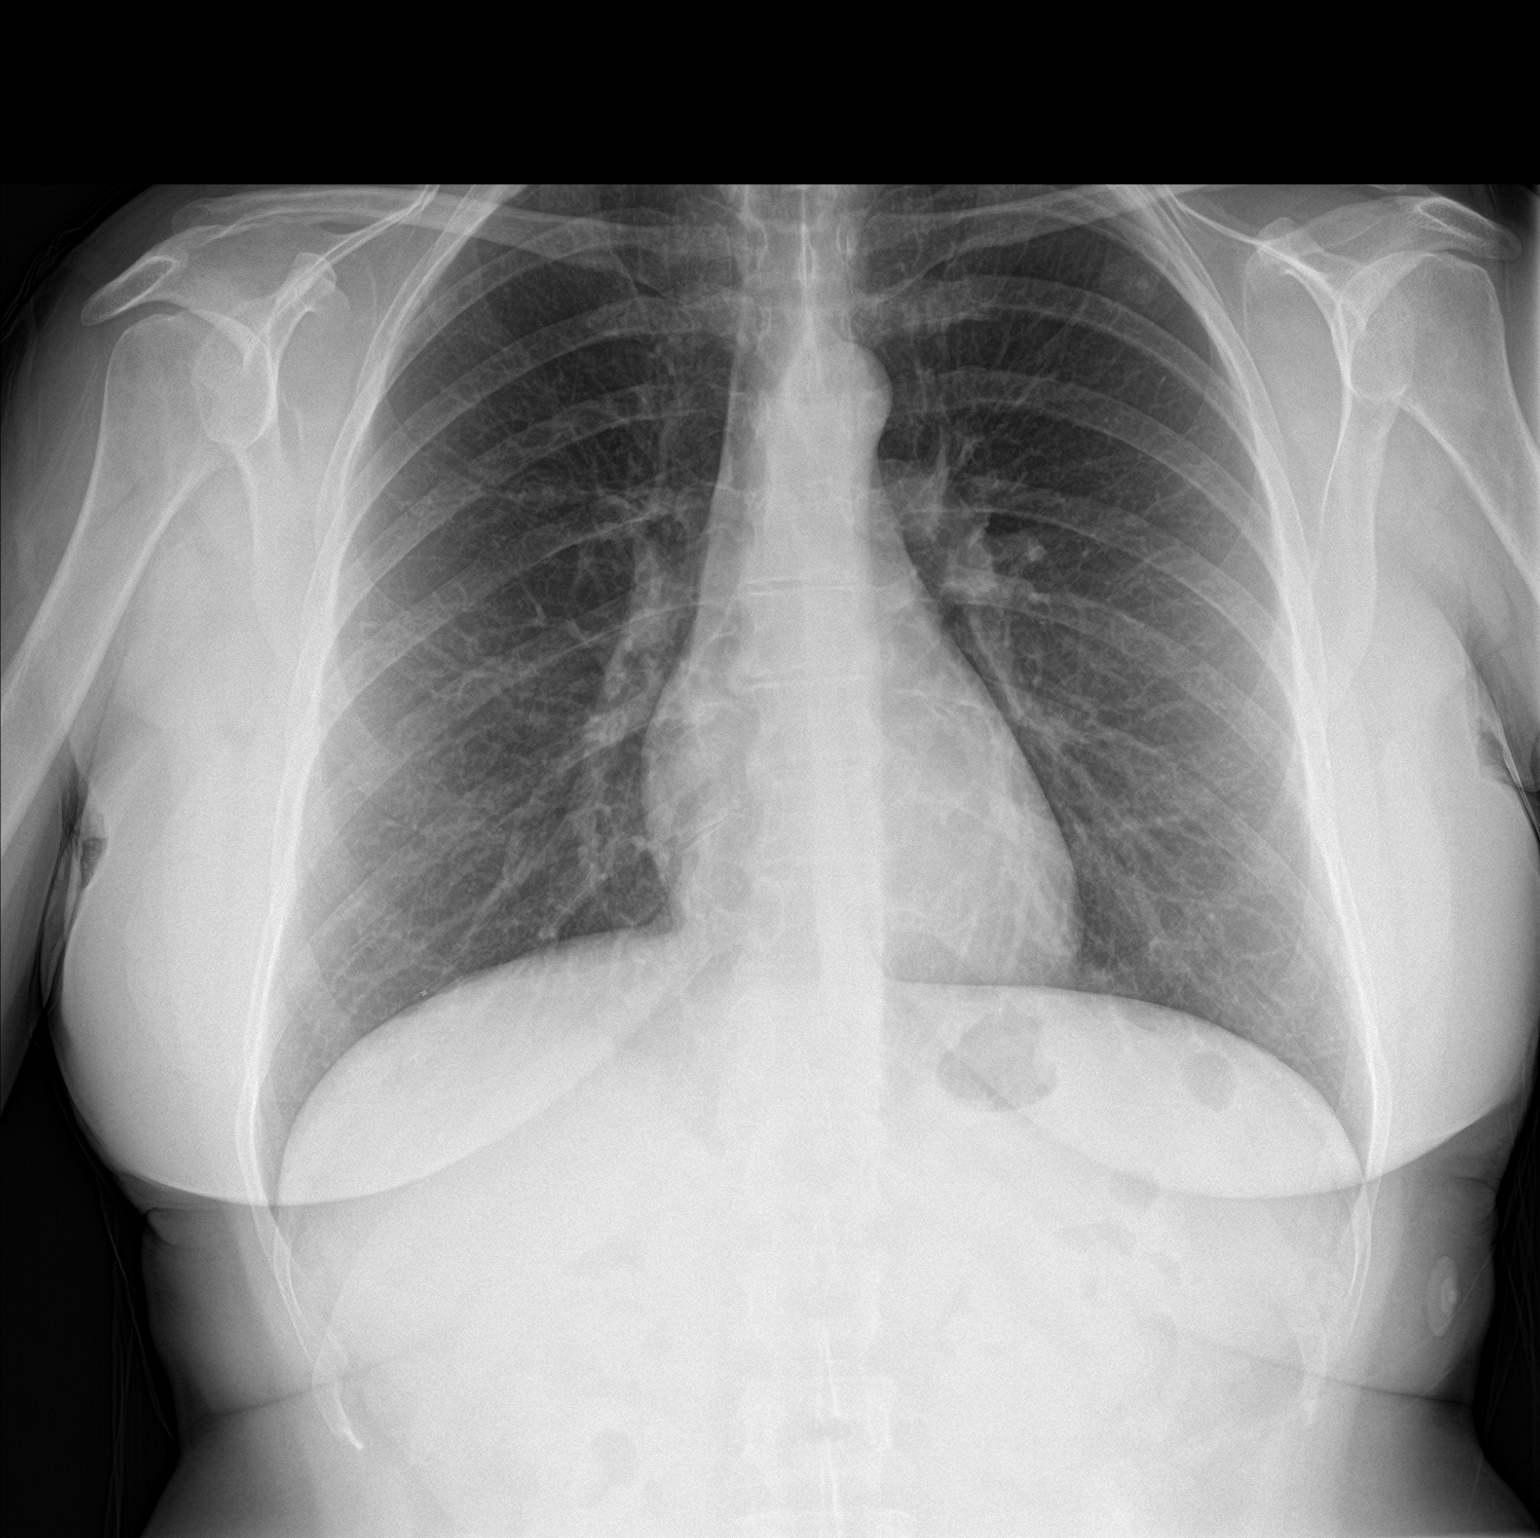

[chest lat]
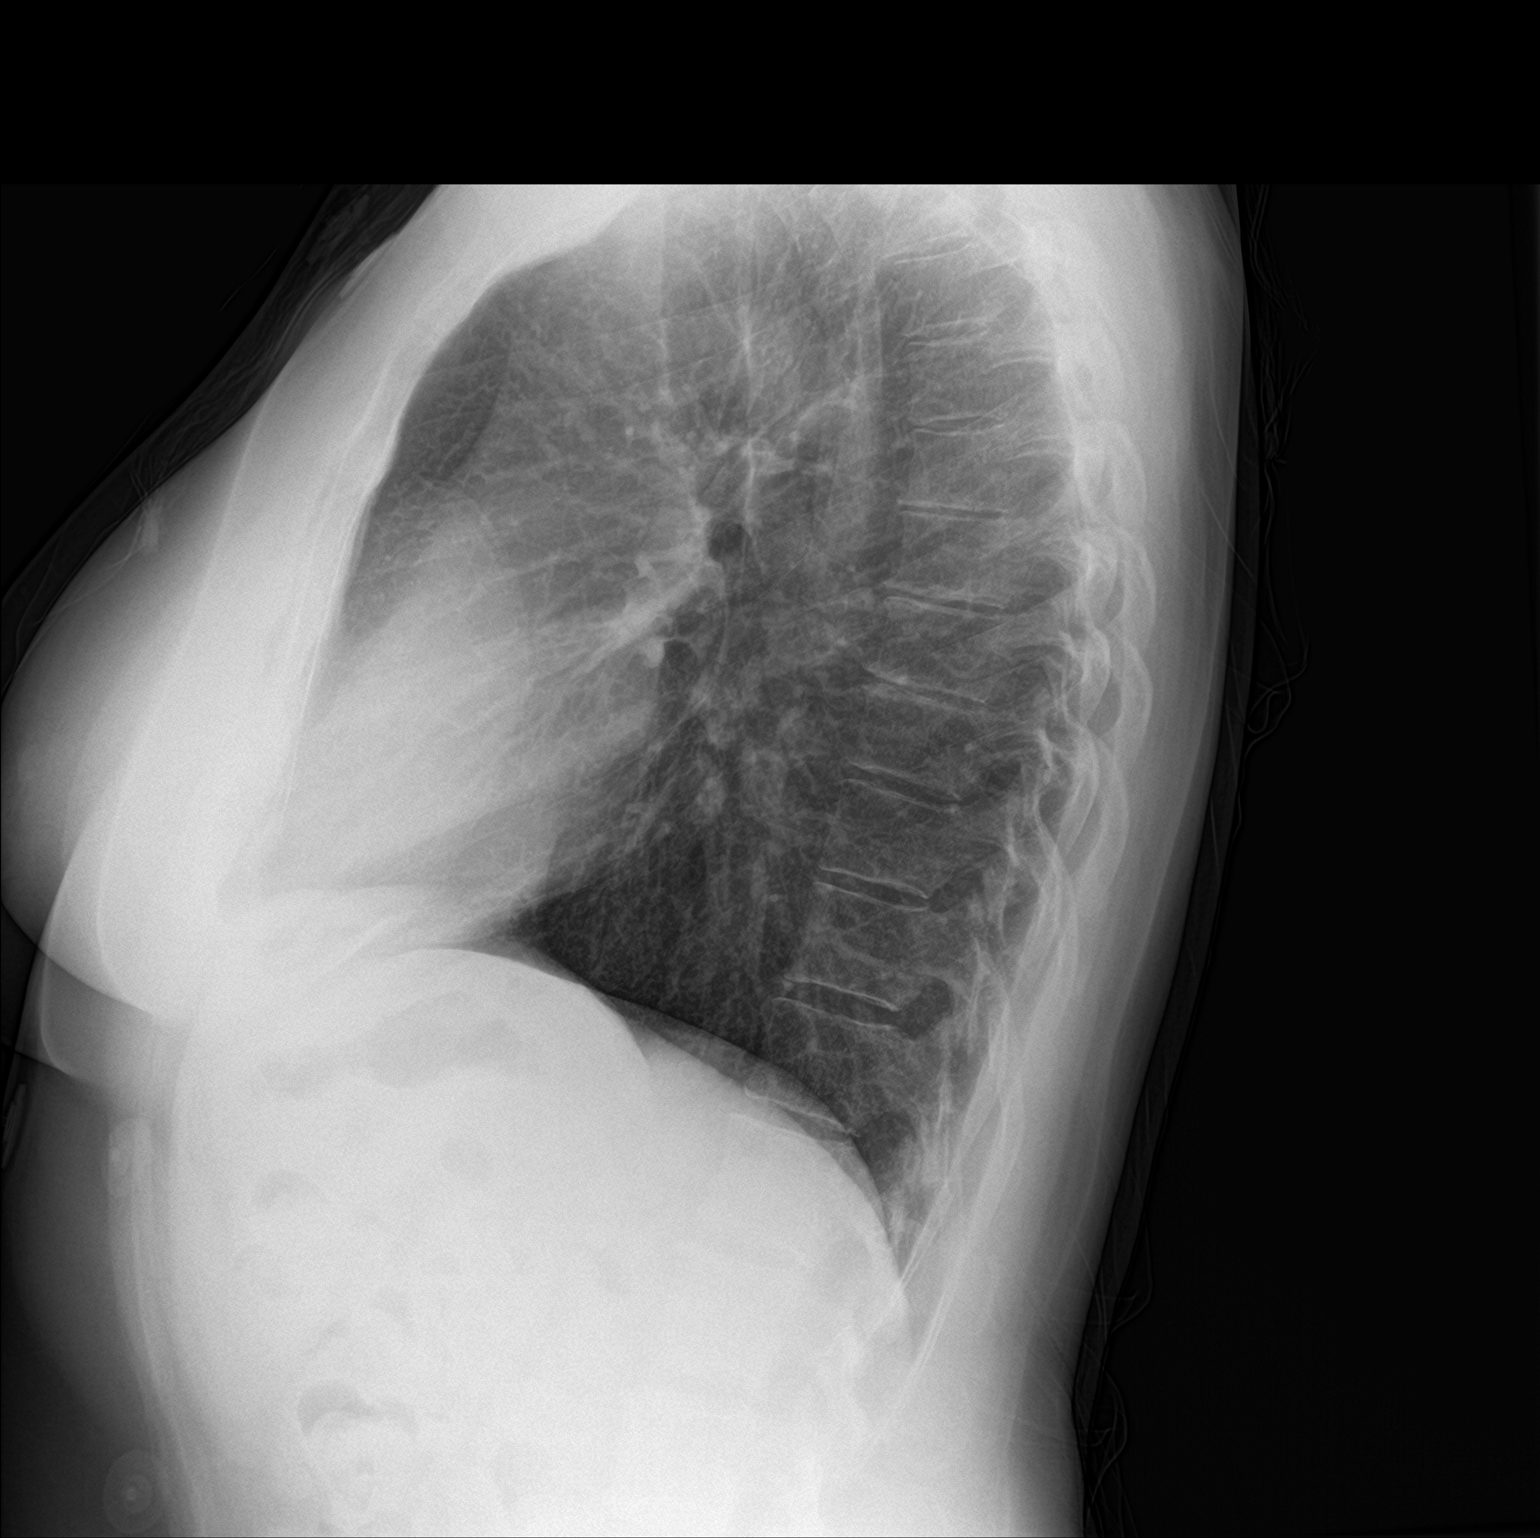

[2 of 2 positions shown; findings below may reference images not displayed]

FINDINGS: Heart size within normal limits.

There is no appreciable airspace consolidation.

No evidence of pleural effusion or pneumothorax.

No acute bony abnormality identified.
IMPRESSION: No evidence of active cardiopulmonary disease.

## 2021-02-08 ENCOUNTER — Other Ambulatory Visit: Payer: Self-pay | Admitting: Family Medicine

## 2021-02-10 LAB — HM COLONOSCOPY

## 2021-02-19 ENCOUNTER — Other Ambulatory Visit: Payer: Self-pay

## 2021-02-19 ENCOUNTER — Ambulatory Visit: Payer: 59 | Admitting: Sports Medicine

## 2021-02-19 VITALS — BP 122/78 | Ht 66.0 in | Wt 157.0 lb

## 2021-02-19 DIAGNOSIS — M25562 Pain in left knee: Secondary | ICD-10-CM

## 2021-02-19 MED ORDER — MELOXICAM 15 MG PO TABS: ORAL_TABLET | ORAL | 0 refills | Status: DC

## 2021-02-20 ENCOUNTER — Ambulatory Visit (INDEPENDENT_AMBULATORY_CARE_PROVIDER_SITE_OTHER): Payer: 59

## 2021-02-20 ENCOUNTER — Encounter: Payer: Self-pay | Admitting: Sports Medicine

## 2021-02-20 DIAGNOSIS — Z23 Encounter for immunization: Secondary | ICD-10-CM

## 2021-02-20 NOTE — Progress Notes (Signed)
   Subjective:    Patient ID: Connie Knox, female    DOB: 22-Nov-1961, 60 y.o.   MRN: [id number]  HPI chief complaint: Left knee pain  Very pleasant 60 year old female comes in today after having injured her left knee yesterday while working out.  While participating in a circuit training class, she injured the knee while trying to perform a backwards kick.  She attempted to strike a heavy bag but missed.  She felt a pop in her knee at that time but was able to continue on with the class.  Last night, she experienced significant pain that she localizes primarily to the lateral aspect of the knee.  She has not noticed any swelling.  She denies significant injury to this knee in the past.  Pain is worse when sitting with the knee in a bent position and actually improves with ambulation.  Pain does occasionally radiate into her calf.  No pain more proximally in the groin.  No prior knee surgeries.  Past medical history reviewed Medications reviewed Allergies reviewed    Review of Systems As above    Objective:   Physical Exam  Well-developed, well-nourished.  No acute distress  Left knee: Patient demonstrates range of motion from 0 to 125 degrees.  Uninvolved right knee is a full 0 to 140 degrees.  There is no obvious effusion.  There is tenderness to palpation along the distal IT band and lateral femoral condyle.  Some tenderness to palpation along the lateral joint line as well.  No tenderness along the medial joint line.  Knee is stable to valgus and varus stressing.  Negative anterior drawer, negative posterior drawer.  Negative patellar apprehension.  Negative McMurray's.  Negative Thessaly's.  Neurovascularly intact distally.  Walking with a noticeable limp.  Limited MSK ultrasound of the left knee was performed.  There is no obvious knee effusion.  Visualized portion of the medial and lateral menisci show no obvious tearing.  There is spurring along the medial joint space.  There is  also a small amount of fluid just beneath the distal IT band at the lateral femoral condyle.  Findings are consistent with possible distal IT band strain.      Assessment & Plan:   Left knee pain likely secondary to distal IT band strain  Patient has been utilizing KT tape with some improvement.  She may also use a body helix compression sleeve.  I would like for her to start meloxicam 15 mg daily for 7 days then as needed.  Of note, she does list Aleve as an allergy but is able to take other NSAIDs without difficulty.  I encouraged her to ice the lateral knee 3 times daily for the next 48 hours.  She may slowly resume activity as tolerated.  I would look for her symptoms to improve over the next 2 to 3 weeks.  If not, consider further diagnostic imaging.  Follow-up for ongoing or recalcitrant issues.

## 2021-08-10 ENCOUNTER — Encounter: Payer: Self-pay | Admitting: Family Medicine

## 2021-08-10 ENCOUNTER — Telehealth (INDEPENDENT_AMBULATORY_CARE_PROVIDER_SITE_OTHER): Payer: 59 | Admitting: Family Medicine

## 2021-08-10 VITALS — Temp 98.8°F

## 2021-08-10 DIAGNOSIS — U071 COVID-19: Secondary | ICD-10-CM | POA: Diagnosis not present

## 2021-08-10 DIAGNOSIS — R059 Cough, unspecified: Secondary | ICD-10-CM

## 2021-08-10 MED ORDER — MOLNUPIRAVIR EUA 200MG CAPSULE: 4.0000 | ORAL_CAPSULE | Freq: Two times a day (BID) | ORAL | 0 refills | Status: AC

## 2021-08-10 NOTE — Progress Notes (Signed)
VIRTUAL VISIT VIA VIDEO  I connected with Connie Knox on 08/10/21 at  4:00 PM EDT by elemedicine application and verified that I am speaking with the correct person using two identifiers. Location patient: Home Location provider: [facility], Office Persons participating in the virtual visit: Patient, Dr. Raoul Pitch and Darnell Level. Cesar, CMA  I discussed the limitations of evaluation and management by telemedicine and the availability of in person appointments. The patient expressed understanding and agreed to proceed.   SUBJECTIVE Chief Complaint  Patient presents with   Covid Positive    Pt c/o sore throat, nasal congestion, runny nose sx starting today; tested pos at home today     HPI: Connie Knox is a 60 y.o. female present for COVID-positive test at home today. Exposure: Husband.  They think has been had come in contact with COVID at the gym. Sx start: Within the last 24 hours. Vaccine:pfizer x3- last > 6 mos.  Pt endorses cough, sore throat , ear pain, nasal congestion, runny nose, fatigue. No shortness of breath. Pt denies fever, chills. Vomit.  Diarrhea or shortness of breath. Otc: theraflu. Throat lozenges.  GFR: >60 Anticoag: n/a  ROS: See pertinent positives and negatives per HPI.  Patient Active Problem List   Diagnosis Date Noted   Stage 3a chronic kidney disease ([place]) 01/12/2021   Hand arthritis 01/12/2021   Osteopenia 11/27/2020   Anxiety 10/04/2019   Hearing loss in right ear    Adenomatous colon polyp 02/14/2017   GERD (gastroesophageal reflux disease) 06/11/2011   Dyslipidemia 06/11/2011    Social History   Tobacco Use   Smoking status: Former   Smokeless tobacco: Never   Tobacco comments:    "smoked cigarettes as a teen"  Substance Use Topics   Alcohol use: Yes    Alcohol/week: 14.0 standard drinks    Types: 14 Shots of liquor per week    Comment: 05/12/2016 "2 shots vodka/day"    Current Outpatient Medications:    azelastine  (ASTELIN) 0.1 % nasal spray, Place 2 sprays into both nostrils 2 (two) times daily. Use in each nostril as directed, Disp: 30 mL, Rfl: 12   famotidine (PEPCID) 20 MG tablet, Take 1 tablet (20 mg total) by mouth 2 (two) times daily., Disp: 60 tablet, Rfl: 5   meloxicam (MOBIC) 15 MG tablet, Take 1 tablet daily with food for 7 days. Then take as needed., Disp: 40 tablet, Rfl: 0   molnupiravir EUA 200 mg CAPS, Take 4 capsules (800 mg total) by mouth 2 (two) times daily for 5 days., Disp: 40 capsule, Rfl: 0   Multiple Vitamin (MULTI-VITAMIN DAILY PO), Take 1 tablet by mouth daily., Disp: , Rfl:    Omega-3 Fatty Acids (FISH OIL PO), Take by mouth daily., Disp: , Rfl:    pantoprazole (PROTONIX) 40 MG tablet, Take 1 tablet (40 mg total) by mouth daily., Disp: 90 tablet, Rfl: 3   sertraline (ZOLOFT) 25 MG tablet, Take by mouth., Disp: , Rfl:    VITAMIN D PO, Take by mouth daily., Disp: , Rfl:    zolpidem (AMBIEN) 10 MG tablet, Take 10 mg by mouth at bedtime as needed. sleep, Disp: , Rfl: 0  Allergies  Allergen Reactions   Aleve [Naproxen Sodium] Palpitations    OBJECTIVE: Temp 98.8 F (37.1 C) (Oral)  Gen: No acute distress. Nontoxic in appearance.  HENT: AT. [place].  MMM.  Eyes:Pupils Equal Round Reactive to light, Extraocular movements intact,  Conjunctiva without redness, discharge or icterus.  Chest: Cough not present on exam. Skin: no rashes, purpura or petechiae.  Neuro: Alert. Oriented x3  Psych: Normal affect and demeanor. Normal speech. Normal thought content and judgment.  ASSESSMENT AND PLAN: Connie Knox is a 60 y.o. female present for  COVID-19/cough Rest, hydrate.  Recommended Mucinex DM OTC. Molnupiravir  prescribed, take until completed.  Reviewed home care instructions for COVID. Advised self-isolation at home for at least 5 days. After 5 days, if improved and fever resolved, can be in public, but should wear a mask around others for an additional 5 days. If symptoms, esp,  dyspnea develops/worsens, recommend in-person evaluation at either an urgent care or the emergency room.      Howard Pouch, DO 08/10/2021   Return in about 1 week (around 08/17/2021), or if symptoms worsen or fail to improve.  No orders of the defined types were placed in this encounter.  Meds ordered this encounter  Medications   molnupiravir EUA 200 mg CAPS    Sig: Take 4 capsules (800 mg total) by mouth 2 (two) times daily for 5 days.    Dispense:  40 capsule    Refill:  0   Referral Orders  No referral(s) requested today

## 2021-08-10 NOTE — Patient Instructions (Signed)
10 Things You Can Do to Manage Your COVID-19 Symptoms at Home If you have possible or confirmed COVID-19 Stay home except to get medical care. Monitor your symptoms carefully. If your symptoms get worse, call your healthcare provider immediately. Get rest and stay hydrated. If you have a medical appointment, call the healthcare provider ahead of time and tell them that you have or may have COVID-19. For medical emergencies, call 911 and notify the dispatch personnel that you have or may have COVID-19. Cover your cough and sneezes with a tissue or use the inside of your elbow. Wash your hands often with soap and water for at least 20 seconds or clean your hands with an alcohol-based hand sanitizer that contains at least 60% alcohol. As much as possible, stay in a specific room and away from other people in your home. Also, you should use a separate bathroom, if available. If you need to be around other people in or outside of the home, wear a mask. Avoid sharing personal items with other people in your household, like dishes, towels, and bedding. Clean all surfaces that are touched often, like counters, tabletops, and doorknobs. Use household cleaning sprays or wipes according to the label instructions. cdc.gov/coronavirus 07/04/2020 This information is not intended to replace advice given to you by your health care provider. Make sure you discuss any questions you have with your healthcare provider. Document Revised: 01/23/2021 Document Reviewed: 01/23/2021 Elsevier Patient Education  2022 Elsevier Inc.  

## 2021-08-19 ENCOUNTER — Telehealth: Payer: Self-pay | Admitting: Family Medicine

## 2021-08-19 NOTE — Telephone Encounter (Signed)
Spoke with pt regarding Covid instructions from Dr. Lucita Lora note. Pt verbalized understanding.  Dr. Lucita Lora Note: Advised self-isolation at home for at least 5 days. After 5 days, if improved and fever resolved, can be in public, but should wear a mask around others for an additional 5 days. If symptoms, esp, dyspnea develops/worsens, recommend in-person evaluation at either an urgent care or the emergency room.

## 2021-08-19 NOTE — Telephone Encounter (Signed)
Patient states she has completed course of molnupiravir started on 08/10/21 but still testing positive. She feels fine and has no symptoms but wonders if it is normal to still test positive for Covid. Please call patient to advise.

## 2021-09-11 ENCOUNTER — Telehealth: Payer: Self-pay | Admitting: Family Medicine

## 2021-09-11 NOTE — Telephone Encounter (Signed)
Pt sched for 8 Monday. Pt is to go to ED if sx worsen.

## 2021-09-11 NOTE — Telephone Encounter (Signed)
Pt was transferred to [place] from triage to see PCP, what is the recommendation for Patient?

## 2021-09-11 NOTE — Telephone Encounter (Signed)
[place] - Day Physician Howard Pouch Contact Type Call Who Is Calling Patient / Member / Family / Caregiver Call Type Triage / Clinical Relationship To Patient Self Return Phone Number [phone] (Primary) Chief Complaint CHEST PAIN - pain, pressure, heaviness or tightness Reason for Call Symptomatic / Request for Connie Knox states she is post COVID and yesterday she has been experiencing shortness of breath, upper back pain and chest tightness. Translation No Nurse Assessment Nurse: Rolena Infante, RN, Patrice Date/Time (Eastern Time): 09/11/2021 9:13:19 AM Confirm and document reason for call. If symptomatic, describe symptoms. ---Caller states she was dx with COVID 3 weeks ago. Now for a week she has felt some tightness. Last night having pain right chest and back Feels okay right now. Feels ?? pressure like someone hugging her. Denies SOB. Taken all her medication prescribed. Had negative test afterwards. Does the patient have any new or worsening symptoms? ---Yes Will a triage be completed? ---Yes Related visit to physician within the last 2 weeks? ---No Does the PT have any chronic conditions? (i.e. diabetes, asthma, this includes High risk factors for pregnancy, etc.) ---Yes List chronic conditions. ---Hx Panic attacks Is this a behavioral health or substance abuse call? ---No Guidelines Guideline Title Affirmed Question Affirmed Notes Nurse Date/Time [name] Time) COVID-19 - Persisting Symptoms Follow-up Call [1] PERSISTING SYMPTOMS OF COVID-19 AND [2] NEW symptom AND [3] could be serious Rolena Infante, RN, Patrice 09/11/2021 9:18:26 AM PLEASE NOTE: All timestamps contained within this report are represented as [place] Standard Time. CONFIDENTIALTY NOTICE: This fax transmission is intended only for the addressee. It contains information that is legally privileged, confidential or otherwise protected from use or  disclosure. If you are not the intended recipient, you are strictly prohibited from reviewing, disclosing, copying using or disseminating any of this information or taking any action in reliance on or regarding this information. If you have received this fax in error, please notify us immediately by telephone so that we can arrange for its return to [place]. Phone: [phone], Toll-Free: [phone], Fax: [phone] Page: 2 of 2 Call Id: [id number] [place]. Time [name] Time) Disposition Final User 09/11/2021 9:12:15 AM Send to Urgent Zannie Kehr 09/11/2021 9:29:23 AM Call PCP Now Yes Rolena Infante, RN, Patrice Caller Disagree/Comply Comply Caller Understands Yes PreDisposition Call Doctor Care Advice Given Per Guideline CALL PCP NOW: * You need to discuss this with your doctor (or NP/PA). CALL BACK IF: * You become worse

## 2021-09-11 NOTE — Telephone Encounter (Signed)
Awaiting triage note. Will contact pt afterwards.    FYI

## 2021-09-11 NOTE — Telephone Encounter (Signed)
Pt called noting she had covid 2-3 wks ago. She said yesterday she was at work and started feeling a "hug on the inside" and some chest pain/tightness going around into upper right back & mild SOB. Transferred call to [facility] with [facility].

## 2021-09-14 ENCOUNTER — Encounter: Payer: Self-pay | Admitting: Family Medicine

## 2021-09-14 ENCOUNTER — Other Ambulatory Visit: Payer: Self-pay

## 2021-09-14 ENCOUNTER — Ambulatory Visit (HOSPITAL_BASED_OUTPATIENT_CLINIC_OR_DEPARTMENT_OTHER)
Admission: RE | Admit: 2021-09-14 | Discharge: 2021-09-14 | Disposition: A | Discharge status: Home / Self Care | Payer: 59 | Source: Ambulatory Visit | Attending: Family Medicine | Admitting: Family Medicine

## 2021-09-14 ENCOUNTER — Ambulatory Visit (INDEPENDENT_AMBULATORY_CARE_PROVIDER_SITE_OTHER): Payer: 59 | Admitting: Family Medicine

## 2021-09-14 VITALS — BP 103/57 | HR 60 | Temp 97.3°F | Ht 66.0 in | Wt 156.0 lb

## 2021-09-14 DIAGNOSIS — R0789 Other chest pain: Secondary | ICD-10-CM | POA: Insufficient documentation

## 2021-09-14 DIAGNOSIS — Z8616 Personal history of COVID-19: Secondary | ICD-10-CM | POA: Diagnosis not present

## 2021-09-14 LAB — CBC WITH DIFFERENTIAL/PLATELET
Basophils Absolute: 0.1 10*3/uL (ref 0.0–0.1)
Basophils Relative: 0.9 % (ref 0.0–3.0)
Eosinophils Absolute: 0.1 10*3/uL (ref 0.0–0.7)
Eosinophils Relative: 2.3 % (ref 0.0–5.0)
HCT: 39 % (ref 36.0–46.0)
Hemoglobin: 13 g/dL (ref 12.0–15.0)
Lymphocytes Relative: 47.5 % — ABNORMAL HIGH (ref 12.0–46.0)
Lymphs Abs: 2.9 10*3/uL (ref 0.7–4.0)
MCHC: 33.3 g/dL (ref 30.0–36.0)
MCV: 102.2 fl — ABNORMAL HIGH (ref 78.0–100.0)
Monocytes Absolute: 0.5 10*3/uL (ref 0.1–1.0)
Monocytes Relative: 7.9 % (ref 3.0–12.0)
Neutro Abs: 2.6 10*3/uL (ref 1.4–7.7)
Neutrophils Relative %: 41.4 % — ABNORMAL LOW (ref 43.0–77.0)
Platelets: 320 10*3/uL (ref 150.0–400.0)
RBC: 3.81 Mil/uL — ABNORMAL LOW (ref 3.87–5.11)
RDW: 13.1 % (ref 11.5–15.5)
WBC: 6.2 10*3/uL (ref 4.0–10.5)

## 2021-09-14 MED ORDER — METHYLPREDNISOLONE ACETATE 80 MG/ML IJ SUSP
80.0000 mg | Freq: Once | INTRAMUSCULAR | Status: AC
  Administered 2021-09-14: 80 mg via INTRAMUSCULAR

## 2021-09-14 MED ORDER — AZITHROMYCIN 250 MG PO TABS: ORAL_TABLET | ORAL | 0 refills | Status: AC

## 2021-09-14 NOTE — Progress Notes (Signed)
This visit occurred during the SARS-CoV-2 public health emergency.  Safety protocols were in place, including screening questions prior to the visit, additional usage of staff PPE, and extensive cleaning of exam room while observing appropriate contact time as indicated for disinfecting solutions.    Connie Knox , [birth year], 60 y.o., female MRN: [id number] Patient Care Team    Relationship Specialty Notifications Start End  Connie Hillock, DO PCP - General Family Medicine  06/30/18   Connie Knox  Optometry  07/03/18   Connie Farrier, MD Consulting Physician Obstetrics and Gynecology  07/03/18   Connie Campbell, MD Consulting Physician Gastroenterology  07/03/18     Chief Complaint  Patient presents with   Chest Pain    Pt c/o chest tightness, SOB x 1 week; dx with COVID 08/14/21;      Subjective: Pt presents for an OV with complaints of symptoms s/p covid positive 08/10/2021. + palpitations/anxiety sensation.  + SOB, sharp pleuritic pain right and in back x2.  "Chest tightness mid line.  Late day fatigue Able to golf yesterday and did ok, but still felt "off" Pt tx molnupiravir and overall had mild covid sx after until 1 week   Depression screen [facility] 2/9 10/13/2020 06/30/2018  Decreased Interest 0 0  Down, Depressed, Hopeless 0 0  PHQ - 2 Score 0 0    Allergies  Allergen Reactions   Aleve [Naproxen Sodium] Palpitations   Social History   Social History Narrative   Married.  2 children.   Bachelors degree.  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   Drinks alcohol socially.  Former smoker.   Exercises routinely.   Eats dairy products.   Drinks caffeine.   Had smoke alarm in the home.   Wears her seatbelt.   Feels safe in her relationships.   Past Medical History:  Diagnosis Date   Adenomatous colon polyp    Arthritis    "knees" (05/12/2016)   BCC (basal cell carcinoma of skin) 12/2018   BCC mid-chest and shin   Chicken pox     Depression    Dyslipidemia    GERD (gastroesophageal reflux disease)    Hearing loss in right ear    chronic   Panic attack    Pubic ramus fracture ([place]) 05/11/2016   fell down carpeted steps.  Pubic ramus fracture, sacral fracture.   Past Surgical History:  Procedure Laterality Date   DILATION AND CURETTAGE OF UTERUS  X 9   "all miscarriages"   Family History  Problem Relation Age of Onset   Lung cancer Maternal Grandmother    Stroke Maternal Grandfather    Ulcers Maternal Grandfather    Breast cancer Paternal Grandmother    Stomach cancer Paternal Grandfather    Allergies as of 09/14/2021       Reactions   Aleve [naproxen Sodium] Palpitations        Medication List        Accurate as of September 14, 2021  8:47 AM. If you have any questions, ask your nurse or doctor.          STOP taking these medications    azelastine 0.1 % nasal spray Commonly known as: ASTELIN Stopped by: Howard Pouch, DO   famotidine 20 MG tablet Commonly known as: Pepcid Stopped by: Howard Pouch, DO   meloxicam 15 MG tablet Commonly known as: MOBIC Stopped by: Howard Pouch, DO   sertraline 25 MG tablet  Commonly known as: ZOLOFT Stopped by: Howard Pouch, DO       TAKE these medications    azithromycin 250 MG tablet Commonly known as: ZITHROMAX Take 2 tablets on day 1, then 1 tablet daily on days 2 through 5 Started by: Howard Pouch, DO   calcium carbonate 500 MG chewable tablet Commonly known as: TUMS - dosed in mg elemental calcium Chew 1 tablet by mouth daily.   FISH OIL PO Take by mouth daily.   fluticasone 50 MCG/ACT nasal spray Commonly known as: FLONASE Place into both nostrils daily.   MULTI-VITAMIN DAILY PO Take 1 tablet by mouth daily.   pantoprazole 40 MG tablet Commonly known as: PROTONIX Take 1 tablet (40 mg total) by mouth daily.   VITAMIN D PO Take by mouth daily.   zolpidem 10 MG tablet Commonly known as: AMBIEN Take 10 mg by mouth at  bedtime as needed. sleep        All past medical history, surgical history, allergies, family history, immunizations andmedications were updated in the EMR today and reviewed under the history and medication portions of their EMR.     ROS: Negative, with the exception of above mentioned in HPI   Objective:  BP (!) 103/57   Pulse 60   Temp (!) 97.3 F (36.3 C) (Oral)   Ht 5\' 6"  (1.676 m)   Wt 156 lb (70.8 kg)   SpO2 98%   BMI 25.18 kg/m  Body mass index is 25.18 kg/m. Gen: Afebrile. No acute distress. Nontoxic in appearance, well developed, well nourished.  HENT: AT. [place].  Eyes:Pupils Equal Round Reactive to light, Extraocular movements intact,  Conjunctiva without redness, discharge or icterus. Neck/lymp/endocrine: Supple,no lymphadenopathy CV: RRR no murmur.  Chest: CTAB, no wheeze or crackles. Good air movement left. Mildly diminished air sounds right, normal resp effort.  Skin: no rashes, purpura or petechiae.  Neuro: Normal gait. PERLA. EOMi. Alert. Oriented x3  Psych: Normal affect, dress and demeanor. Normal speech. Normal thought content and judgment.  No results found. No results found. No results found for this or any previous visit (from the past 24 hour(s)).  Assessment/Plan: [name] is a 60 y.o. female present for OV for  Chest discomfort/ History of COVID-19 VSS. Symptoms likely pneumonia, will start z-pack and administered prednisone IM today to help w/ possible inflammatory response.  Can not r/o PE s/p covid. Ordered d-dimer and pt aware if positive will likely need to be seen in ED and/or emergent CTA. Azith Depo medrol 80 IM - DG Chest 2 View; Future - CBC w/Diff - D-Dimer, Quantitative Pt will be called with results and further plan discussed. .   Reviewed expectations re: course of current medical issues. Discussed self-management of symptoms. Outlined signs and symptoms indicating need for more acute intervention. Patient verbalized  understanding and all questions were answered. Patient received an After-Visit Summary.    Orders Placed This Encounter  Procedures   DG Chest 2 View   CBC w/Diff   D-Dimer, Quantitative   Meds ordered this encounter  Medications   azithromycin (ZITHROMAX) 250 MG tablet    Sig: Take 2 tablets on day 1, then 1 tablet daily on days 2 through 5    Dispense:  6 tablet    Refill:  0   methylPREDNISolone acetate (DEPO-MEDROL) injection 80 mg   Referral Orders  No referral(s) requested today     Note is dictated utilizing voice recognition software. Although note has been proof read  prior to signing, occasional typographical errors still can be missed. If any questions arise, please do not hesitate to call for verification.   electronically signed by:  Howard Pouch, DO  [place]

## 2021-09-14 NOTE — Patient Instructions (Signed)
Great to see you today.  I have refilled the medication(s) we provide.   If labs were collected, we will inform you of lab results once received either by echart message or telephone call.   - echart message- for normal results that have been seen by the patient already.   - telephone call: abnormal results or if patient has not viewed results in their echart.  Please have xray completed at the [address] location off [address]   Start z-pack- I suspect possible walking pneumonia as cause of symptoms.   Community-Acquired Pneumonia, Adult Pneumonia is an infection of the lungs. It causes irritation and swelling in the airways of the lungs. Mucus and fluid may also build up inside the airways. This may cause coughing and trouble breathing. One type of pneumonia can happen while you are in a hospital. A different type can happen when you are not in a hospital (community-acquired pneumonia). What are the causes? This condition is caused by germs (viruses, bacteria, or fungi). Some types of germs can spread from person to person. Pneumonia is not thought to spread from person to person. What increases the risk? You are more likely to develop this condition if: You have a long-term (chronic) disease, such as: Disease of the lungs. This may be chronic obstructive pulmonary disease (COPD) or asthma. Heart failure. Cystic fibrosis. Diabetes. Kidney disease. Sickle cell disease. HIV. You have other health problems, such as: Your body's defense system (immune system) is weak. A condition that may cause you to breathe in fluids from your mouth and nose. You had your spleen taken out. You do not take good care of your teeth and mouth (poor dental hygiene). You use or have used tobacco products. You travel where the germs that cause this illness are common. You are near certain animals or the places they live. You are older than 59 years of age. What are the signs or symptoms? Symptoms of  this condition include: A cough. A fever. Sweating or chills. Chest pain, often when you breathe deeply or cough. Breathing problems, such as: Fast breathing. Trouble breathing. Shortness of breath. Feeling tired (fatigued). Muscle aches. How is this treated? Treatment for this condition depends on many things, such as: The cause of your illness. Your medicines. Your other health problems. Most adults can be treated at home. Sometimes, treatment must happen in a hospital. Treatment may include medicines to kill germs. Medicines may depend on which germ caused your illness. Very bad pneumonia is rare. If you get it, you may: Have a machine to help you breathe. Have fluid taken away from around your lungs. Follow these instructions at home: Medicines Take over-the-counter and prescription medicines only as told by your doctor. Take cough medicine only if you are losing sleep. Cough medicine can keep your body from taking mucus away from your lungs. If you were prescribed an antibiotic medicine, take it as told by your doctor. Do not stop taking the antibiotic even if you start to feel better. Lifestyle   Do not drink alcohol. Do not use any products that contain nicotine or tobacco, such as cigarettes, e-cigarettes, and chewing tobacco. If you need help quitting, ask your doctor. Eat a healthy diet. This includes a lot of vegetables, fruits, whole grains, low-fat dairy products, and low-fat (lean) protein. General instructions  Rest a lot. Sleep for at least 8 hours each night. Sleep with your head and neck raised. Put a few pillows under your head or sleep in  a reclining chair. Return to your normal activities as told by your doctor. Ask your doctor what activities are safe for you. Drink enough fluid to keep your pee (urine) pale yellow. If your throat is sore, rinse your mouth often with salt water. To make salt water, dissolve -1 tsp (3-6 g) of salt in 1 cup (237 mL) of warm  water. Keep all follow-up visits as told by your doctor. This is important. How is this prevented? You can lower your risk of pneumonia by: Getting the pneumonia shot (vaccine). These shots have different types and schedules. Ask your doctor what works best for you. Think about getting this shot if: You are older than 60 years of age. You are 59-30 years of age and: You are being treated for cancer. You have long-term lung disease. You have other problems that affect your body's defense system. Ask your doctor if you have one of these. Getting your flu shot every year. Ask your doctor which type of shot is best for you. Going to the dentist as often as told. Washing your hands often with soap and water for at least 20 seconds. If you cannot use soap and water, use hand sanitizer. Contact a doctor if: You have a fever. You lose sleep because your cough medicine does not help. Get help right away if: You are short of breath and this gets worse. You have more chest pain. Your sickness gets worse. This is very serious if: You are an older adult. Your body's defense system is weak. You cough up blood. These symptoms may be an emergency. Do not wait to see if the symptoms will go away. Get medical help right away. Call your local emergency services (911 in the [place] Do not drive yourself to the hospital. Summary Pneumonia is an infection of the lungs. Community-acquired pneumonia affects people who have not been in the hospital. Certain germs can cause this infection. This condition may be treated with medicines that kill germs. For very bad pneumonia, you may need a hospital stay and treatment to help with breathing. This information is not intended to replace advice given to you by your health care provider. Make sure you discuss any questions you have with your health care provider. Document Revised: 09/18/2019 Document Reviewed: 09/18/2019 Elsevier Patient Education  [place].

## 2021-09-15 LAB — D-DIMER, QUANTITATIVE: D-Dimer, Quant: 0.31 mcg/mL FEU (ref <0.50)

## 2022-01-05 IMAGING — DX DG CHEST 2V
2 series · 2 of 2 positions shown · non-contrast
Comparison: October 03, 2020

CLINICAL DATA: covid last month- ? pNA rt lung fields

EXAM:
CHEST - 2 VIEW

[chest pa]
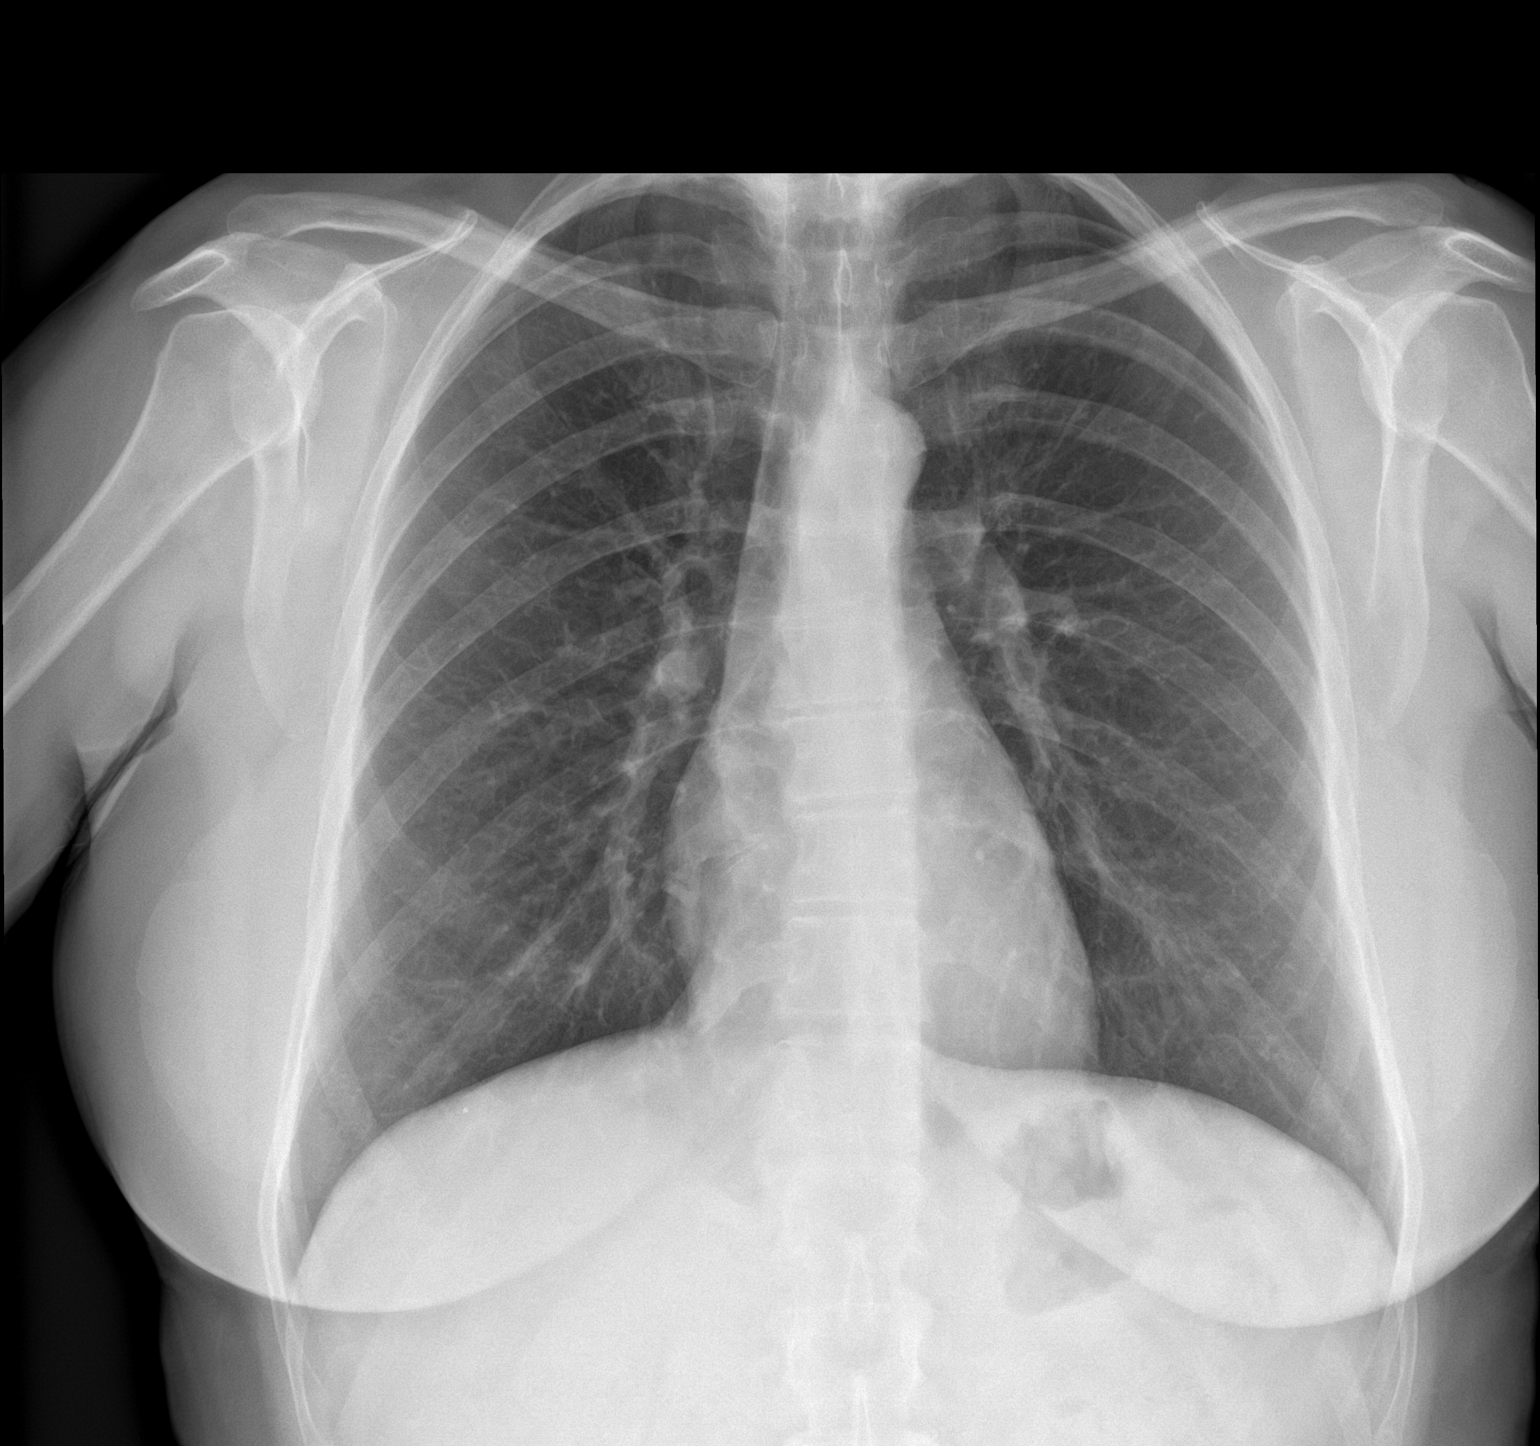

[chest lat]
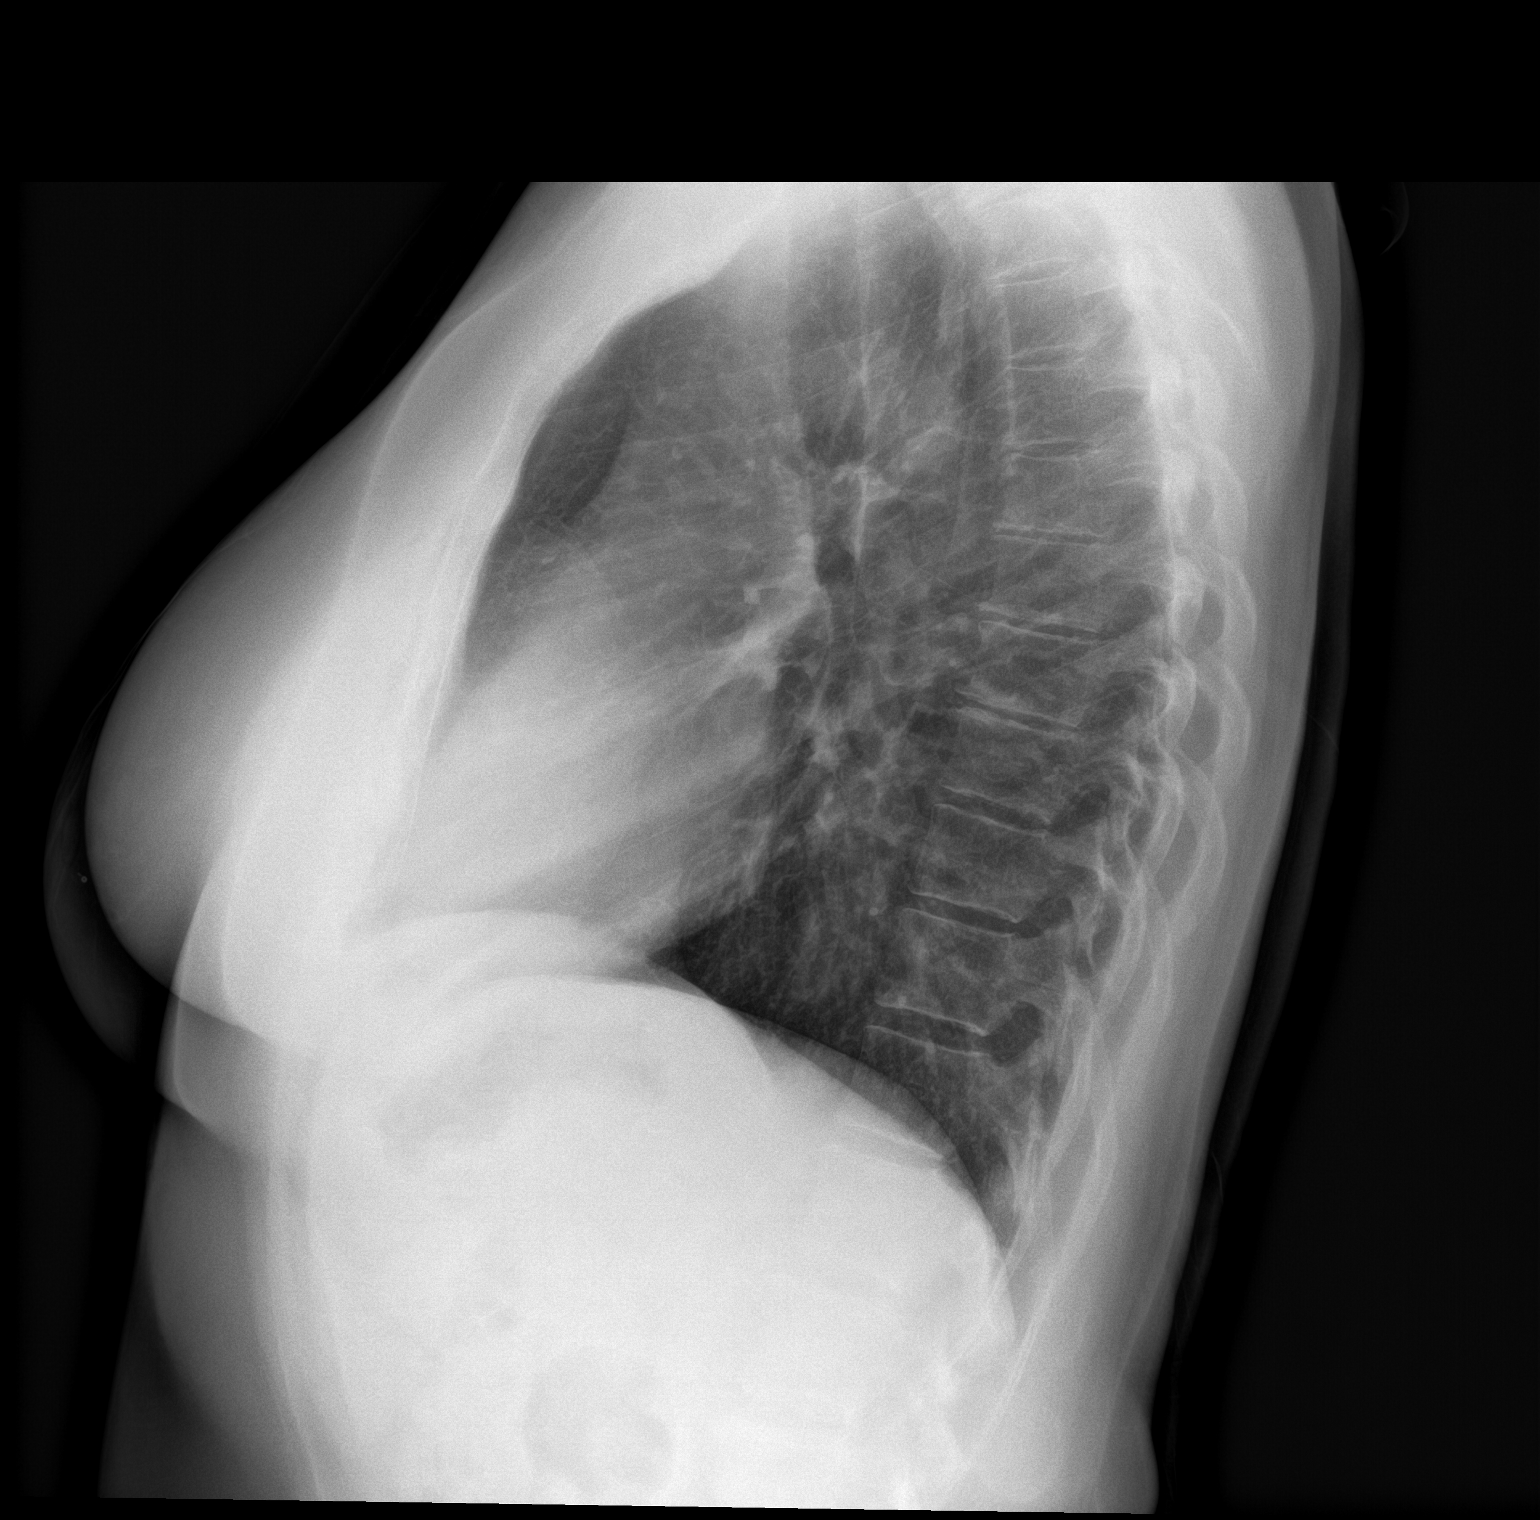

[2 of 2 positions shown; findings below may reference images not displayed]

FINDINGS: The cardiomediastinal silhouette is within normal limits. No pleural
effusion. No pneumothorax. No mass or consolidation. No acute
osseous abnormality.
IMPRESSION: No acute abnormality in the chest.  No consolidative pneumonia.

## 2022-01-13 ENCOUNTER — Encounter: Payer: 59 | Admitting: Family Medicine

## 2022-01-13 ENCOUNTER — Telehealth: Payer: Self-pay | Admitting: Family Medicine

## 2022-01-14 ENCOUNTER — Telehealth: Payer: Self-pay | Admitting: Family Medicine

## 2022-01-14 NOTE — Telephone Encounter (Signed)
FYI Pt called in yesterday to cancel her appointment on 01/13/2022. Pt said she was exposed to someone that had COVID, & she didn't want to come in the office. Informed pt her appointment was already cancelled.

## 2022-01-14 NOTE — Telephone Encounter (Signed)
No action needed

## 2022-02-02 ENCOUNTER — Other Ambulatory Visit: Payer: Self-pay

## 2022-02-02 MED ORDER — PANTOPRAZOLE SODIUM 40 MG PO TBEC: 40.0000 mg | DELAYED_RELEASE_TABLET | Freq: Every day | ORAL | 0 refills | Status: DC

## 2022-02-26 DIAGNOSIS — L821 Other seborrheic keratosis: Secondary | ICD-10-CM | POA: Diagnosis not present

## 2022-02-26 DIAGNOSIS — D1801 Hemangioma of skin and subcutaneous tissue: Secondary | ICD-10-CM | POA: Diagnosis not present

## 2022-02-26 DIAGNOSIS — L814 Other melanin hyperpigmentation: Secondary | ICD-10-CM | POA: Diagnosis not present

## 2022-03-12 DIAGNOSIS — Z01419 Encounter for gynecological examination (general) (routine) without abnormal findings: Secondary | ICD-10-CM | POA: Diagnosis not present

## 2022-03-12 DIAGNOSIS — Z6826 Body mass index (BMI) 26.0-26.9, adult: Secondary | ICD-10-CM | POA: Diagnosis not present

## 2022-03-12 DIAGNOSIS — Z1231 Encounter for screening mammogram for malignant neoplasm of breast: Secondary | ICD-10-CM | POA: Diagnosis not present

## 2022-03-29 NOTE — Telephone Encounter (Signed)
ERROR

## 2022-04-01 ENCOUNTER — Telehealth: Payer: Self-pay

## 2022-04-01 MED ORDER — PANTOPRAZOLE SODIUM 40 MG PO TBEC: 40.0000 mg | DELAYED_RELEASE_TABLET | Freq: Every day | ORAL | 0 refills | Status: DC

## 2022-04-01 NOTE — Addendum Note (Signed)
Addended by: Kavin Leech on: 04/01/2022 09:58 AM ? ? Modules accepted: Orders ? ?

## 2022-04-01 NOTE — Telephone Encounter (Signed)
Patient refill request.  I told patient she must have OV for future refills.  Scheduled appt with Dr. Raoul Pitch on 04/14/22. ? ?Patient states she is out of meds.  Requesting 30 d/s OR enough to get her to appt with provider. ? ?[place] ? ? ?pantoprazole (PROTONIX) 40 MG tablet [[id number]]  ? ?

## 2022-04-01 NOTE — Telephone Encounter (Signed)
Rx sent 

## 2022-04-02 NOTE — Telephone Encounter (Signed)
FYI ?Patient spoke to pharmacy and they are stating insurance is requiring prior authorization on medications. ?Patient asked what is she going to do without her meds, she has been out for a few days, and now is going to be longer. ?She asked if there was OTC?  I told her to check with pharmacy to ask about over the counter until PA is approved. ? ?Patient is not expecting call back until PA is completed. ?

## 2022-04-06 NOTE — Telephone Encounter (Signed)
Previous message never routed to clinic.  ? ?PA received today via fax.  ? ?Key: [id number] - Rx #: [id number] ? ?Drug ?Pantoprazole Sodium '40MG'$  dr tablets ?Form ?[employer] Electronic Request Form (CB) ?

## 2022-04-06 NOTE — Telephone Encounter (Signed)
Pt called again checking on her script. I told her the fax was received today from [facility] and to give Dr Raoul Pitch up to 48 hours to respond. ?

## 2022-04-14 ENCOUNTER — Encounter: Payer: Self-pay | Admitting: Family Medicine

## 2022-04-14 ENCOUNTER — Ambulatory Visit: Payer: BC Managed Care – PPO | Admitting: Family Medicine

## 2022-04-14 VITALS — BP 105/67 | HR 55 | Temp 97.8°F | Ht 66.0 in | Wt 162.0 lb

## 2022-04-14 DIAGNOSIS — K219 Gastro-esophageal reflux disease without esophagitis: Secondary | ICD-10-CM | POA: Diagnosis not present

## 2022-04-14 DIAGNOSIS — R0789 Other chest pain: Secondary | ICD-10-CM

## 2022-04-14 MED ORDER — PANTOPRAZOLE SODIUM 40 MG PO TBEC: 40.0000 mg | DELAYED_RELEASE_TABLET | Freq: Every day | ORAL | 3 refills | Status: DC

## 2022-04-14 NOTE — Telephone Encounter (Signed)
[facility] [id number] ?Status ?Sent to [facility] April 25 ?Drug ?Pantoprazole Sodium '40MG'$  dr tablets ?Form ?[employer] Electronic Request Form (CB) ?

## 2022-04-14 NOTE — Progress Notes (Signed)
? ? ? ?This visit occurred during the SARS-CoV-2 public health emergency.  Safety protocols were in place, including screening questions prior to the visit, additional usage of staff PPE, and extensive cleaning of exam room while observing appropriate contact time as indicated for disinfecting solutions.  ? ? ?Connie Knox , October 10, 1961, 61 y.o., female ?MRN: [id number] ?Patient Care Team  ?  Relationship Specialty Notifications Start End  ?Ma Hillock, DO PCP - General Family Medicine  06/30/18   ?Juluis Rainier  Optometry  07/03/18   ?Everlene Farrier, MD Consulting Physician Obstetrics and Gynecology  07/03/18   ?Richmond Campbell, MD Consulting Physician Gastroenterology  07/03/18   ? ? ?Chief Complaint  ?Patient presents with  ? Gastroesophageal Reflux  ?  [facility]; pt is not fasting  ? ?  ?Subjective: Pt presented last year  for  cough and chest burning when laying flat. She was diagnosed with GERD and prescribed protonix 40 mg qd. Today she would like refills and reports her insurance informed her she will need a prior auth this year. She states without this medication last 4 weeks she can not sleep. She has to sleep upright or she gets heartburn/chest discomfort. She has been tried on nexium and omeprazole in the past and they did not work well for her and she had side effect to nexium. ? ? ? ? ?  04/14/2022  ?  8:34 AM 10/13/2020  ?  8:11 AM 06/30/2018  ?  3:00 PM  ?Depression screen PHQ 2/9  ?Decreased Interest 0 0 0  ?Down, Depressed, Hopeless 0 0 0  ?PHQ - 2 Score 0 0 0  ? ? ?Allergies  ?Allergen Reactions  ? Aleve [Naproxen Sodium] Palpitations  ? ?Social History  ? ?Social History Narrative  ? Married.  2 children.  ? Bachelors degree.  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  ? Drinks alcohol socially.  Former smoker.  ? Exercises routinely.  ? Eats dairy products.  ? Drinks caffeine.  ? Had smoke alarm in the home.  ? Wears her seatbelt.  ? Feels safe in her relationships.  ? ?Past  Medical History:  ?Diagnosis Date  ? Adenomatous colon polyp   ? Arthritis   ? "knees" (05/12/2016)  ? BCC (basal cell carcinoma of skin) 12/2018  ? BCC mid-chest and shin  ? Chicken pox   ? Depression   ? Dyslipidemia   ? GERD (gastroesophageal reflux disease)   ? Hearing loss in right ear   ? chronic  ? Panic attack   ? Pubic ramus fracture ([place]) 05/11/2016  ? fell down carpeted steps.  Pubic ramus fracture, sacral fracture.  ? ?Past Surgical History:  ?Procedure Laterality Date  ? DILATION AND CURETTAGE OF UTERUS  X 9  ? "all miscarriages"  ? ?Family History  ?Problem Relation Age of Onset  ? Lung cancer Maternal Grandmother   ? Stroke Maternal Grandfather   ? Ulcers Maternal Grandfather   ? Breast cancer Paternal Grandmother   ? Stomach cancer Paternal Grandfather   ? ?Allergies as of 04/14/2022   ? ?   Reactions  ? Aleve [naproxen Sodium] Palpitations  ? ?  ? ?  ?Medication List  ?  ? ?  ? Accurate as of April 14, 2022  8:51 AM. If you have any questions, ask your nurse or doctor.  ?  ?  ? ?  ? ?STOP taking these medications   ? ?fluticasone 50  MCG/ACT nasal spray ?Commonly known as: FLONASE ?Stopped by: Howard Pouch, DO ?  ?zolpidem 10 MG tablet ?Commonly known as: AMBIEN ?Stopped by: Howard Pouch, DO ?  ? ?  ? ?TAKE these medications   ? ?calcium carbonate 500 MG chewable tablet ?Commonly known as: TUMS - dosed in mg elemental calcium ?Chew 1 tablet by mouth daily. ?  ?FISH OIL PO ?Take by mouth daily. ?  ?Fish Oil 1000 MG Caps ?Fish Oil ?  ?MULTI-VITAMIN DAILY PO ?Take 1 tablet by mouth daily. ?  ?pantoprazole 40 MG tablet ?Commonly known as: PROTONIX ?Take 1 tablet (40 mg total) by mouth daily. ?  ?[place] Standard ?[place] ?  ?VITAMIN D PO ?Take by mouth daily. ?  ? ?  ? ? ?All past medical history, surgical history, allergies, family history, immunizations andmedications were updated in the EMR today and reviewed under the history and medication portions of their EMR.    ? ?ROS: Negative, with  the exception of above mentioned in HPI ? ? ?Objective:  ?BP 105/67   Pulse (!) 55   Temp 97.8 ?F (36.6 ?C) (Oral)   Ht '5\' 6"'$  (1.676 m)   Wt 162 lb (73.5 kg)   SpO2 98%   BMI 26.15 kg/m?  ?Body mass index is 26.15 kg/m?Marland Kitchen ?Physical Exam ?Vitals and nursing note reviewed.  ?Constitutional:   ?   General: She is not in acute distress. ?   Appearance: Normal appearance. She is normal weight. She is not ill-appearing or toxic-appearing.  ?Eyes:  ?   Extraocular Movements: Extraocular movements intact.  ?   Conjunctiva/sclera: Conjunctivae normal.  ?   Pupils: Pupils are equal, round, and reactive to light.  ?Cardiovascular:  ?   Rate and Rhythm: Normal rate and regular rhythm.  ?   Heart sounds: No murmur heard. ?Pulmonary:  ?   Effort: Pulmonary effort is normal.  ?   Breath sounds: Normal breath sounds.  ?Abdominal:  ?   General: Abdomen is flat.  ?   Palpations: Abdomen is soft.  ?Musculoskeletal:  ?   Right lower leg: No edema.  ?   Left lower leg: No edema.  ?Neurological:  ?   Mental Status: She is alert and oriented to person, place, and time. Mental status is at baseline.  ?Psychiatric:     ?   Mood and Affect: Mood normal.     ?   Behavior: Behavior normal.     ?   Thought Content: Thought content normal.     ?   Judgment: Judgment normal.  ? ? ?No results found. ?No results found. ?No results found for this or any previous visit (from the past 24 hour(s)). ? ?Assessment/Plan: ?Connie Knox is a 61 y.o. female present for OV for  ?Gastroesophageal reflux disease without esophagitis/chest discomfort ?Stable - when on medication.  ?Restart protonix 40 mg qd.  ?She was educated on avoiding meals 3-4 hours prior to laying flat ?Avoid triggers.  ?Tried: nexium (Side effect) and omperazole did not work well for her.  ?If symptoms are not improved > consider GI referral.  ? ? ? ?Reviewed expectations re: course of current medical issues. ?Discussed self-management of symptoms. ?Outlined signs and symptoms  indicating need for more acute intervention. ?Patient verbalized understanding and all questions were answered. ?Patient received an After-Visit Summary. ? ? ? ?No orders of the defined types were placed in this encounter. ? ?Meds ordered this encounter  ?Medications  ? pantoprazole (  PROTONIX) 40 MG tablet  ?  Sig: Take 1 tablet (40 mg total) by mouth daily.  ?  Dispense:  90 tablet  ?  Refill:  3  ? ?Referral Orders  ?No referral(s) requested today  ? ? ?Note is dictated utilizing voice recognition software. Although note has been proof read prior to signing, occasional typographical errors still can be missed. If any questions arise, please do not hesitate to call for verification.  ? ?electronically signed by: ? ?Howard Pouch, DO  ?[facility] [place] ? ? ? ? ?

## 2022-04-14 NOTE — Patient Instructions (Addendum)
?Great to see you today.  ?I have refilled the medication(s) we provide.  ? ?If labs were collected, we will inform you of lab results once received either by echart message or telephone call.  ? - echart message- for normal results that have been seen by the patient already.  ? - telephone call: abnormal results or if patient has not viewed results in their echart. ? ?Food Choices for Gastroesophageal Reflux Disease, Adult ?When you have gastroesophageal reflux disease (GERD), the foods you eat and your eating habits are very important. Choosing the right foods can help ease your discomfort. Think about working with a food expert (dietitian) to help you make good choices. ?What are tips for following this plan? ?Reading food labels ?Look for foods that are low in saturated fat. Foods that may help with your symptoms include: ?Foods that have less than 5% of daily value (DV) of fat. ?Foods that have 0 grams of trans fat. ?Cooking ?Do not fry your food. ?Cook your food by baking, steaming, grilling, or broiling. These are all methods that do not need a lot of fat for cooking. ?To add flavor, try to use herbs that are low in spice and acidity. ?Meal planning ? ?Choose healthy foods that are low in fat, such as: ?Fruits and vegetables. ?Whole grains. ?Low-fat dairy products. ?Lean meats, fish, and poultry. ?Eat small meals often instead of eating 3 large meals each day. Eat your meals slowly in a place where you are relaxed. Avoid bending over or lying down until 2-3 hours after eating. ?Limit high-fat foods such as fatty meats or fried foods. ?Limit your intake of fatty foods, such as oils, butter, and shortening. ?Avoid the following as told by your doctor: ?Foods that cause symptoms. These may be different for different people. Keep a food diary to keep track of foods that cause symptoms. ?Alcohol. ?Drinking a lot of liquid with meals. ?Eating meals during the 2-3 hours before bed. ?Lifestyle ?Stay at a healthy  weight. Ask your doctor what weight is healthy for you. If you need to lose weight, work with your doctor to do so safely. ?Exercise for at least 30 minutes on 5 or more days each week, or as told by your doctor. ?Wear loose-fitting clothes. ?Do not smoke or use any products that contain nicotine or tobacco. If you need help quitting, ask your doctor. ?Sleep with the head of your bed higher than your feet. Use a wedge under the mattress or blocks under the bed frame to raise the head of the bed. ?Chew sugar-free gum after meals. ?What foods should eat? ? ?Eat a healthy, well-balanced diet of fruits, vegetables, whole grains, low-fat dairy products, lean meats, fish, and poultry. Each person is different. ?Foods that may cause symptoms in one person may not cause any symptoms in another person. Work with your doctor to find foods that are safe for you. ?The items listed above may not be a complete list of what you can eat and drink. Contact a food expert for more options. ?What foods should I avoid? ?Limiting some of these foods may help in managing the symptoms of GERD. Everyone is different. Talk with a food expert or your doctor to help you find the exact foods to avoid, if any. ?Fruits ?Any fruits prepared with added fat. Any fruits that cause symptoms. For some people, this may include citrus fruits, such as oranges, grapefruit, pineapple, and lemons. ?Vegetables ?Deep-fried vegetables. [place] fries. Any vegetables prepared with added fat.  Any vegetables that cause symptoms. For some people, this may include tomatoes and tomato products, chili peppers, onions and garlic, and horseradish. ?Grains ?Pastries or quick breads with added fat. ?Meats and other proteins ?High-fat meats, such as fatty beef or pork, hot dogs, ribs, ham, sausage, salami, and bacon. Fried meat or protein, including fried fish and fried chicken. Nuts and nut butters, in large amounts. ?Dairy ?Whole milk and chocolate milk. Sour cream. Cream.  Ice cream. Cream cheese. Milkshakes. ?Fats and oils ?Butter. Margarine. Shortening. Ghee. ?Beverages ?Coffee and tea, with or without caffeine. Carbonated beverages. Sodas. Energy drinks. Fruit juice made with acidic fruits, such as orange or grapefruit. Tomato juice. Alcoholic drinks. ?Sweets and desserts ?Chocolate and cocoa. Donuts. ?Seasonings and condiments ?Pepper. Peppermint and spearmint. Added salt. Any condiments, herbs, or seasonings that cause symptoms. For some people, this may include curry, hot sauce, or vinegar-based salad dressings. ?The items listed above may not be a complete list of what you should not eat and drink. Contact a food expert for more options. ?Questions to ask your doctor ?Diet and lifestyle changes are often the first steps that are taken to manage symptoms of GERD. If diet and lifestyle changes do not help, talk with your doctor about taking medicines. ?Where to find more information ?International Foundation for Gastrointestinal Disorders: aboutgerd.org ?Summary ?When you have GERD, food and lifestyle choices are very important in easing your symptoms. ?Eat small meals often instead of 3 large meals a day. Eat your meals slowly and in a place where you are relaxed. ?Avoid bending over or lying down until 2-3 hours after eating. ?Limit high-fat foods such as fatty meats or fried foods. ?This information is not intended to replace advice given to you by your health care provider. Make sure you discuss any questions you have with your health care provider. ?Document Revised: 06/16/2020 Document Reviewed: 06/16/2020 ?Elsevier Patient Education ? [place]. ? ? ?

## 2022-08-11 ENCOUNTER — Telehealth (INDEPENDENT_AMBULATORY_CARE_PROVIDER_SITE_OTHER): Payer: BC Managed Care – PPO | Admitting: Family Medicine

## 2022-08-11 ENCOUNTER — Encounter: Payer: Self-pay | Admitting: Family Medicine

## 2022-08-11 DIAGNOSIS — R051 Acute cough: Secondary | ICD-10-CM | POA: Diagnosis not present

## 2022-08-11 DIAGNOSIS — U071 COVID-19: Secondary | ICD-10-CM

## 2022-08-11 MED ORDER — BENZONATATE 200 MG PO CAPS: 200.0000 mg | ORAL_CAPSULE | Freq: Two times a day (BID) | ORAL | 0 refills | Status: DC | PRN

## 2022-08-11 MED ORDER — MOLNUPIRAVIR EUA 200MG CAPSULE: 4.0000 | ORAL_CAPSULE | Freq: Two times a day (BID) | ORAL | 0 refills | Status: AC

## 2022-08-11 NOTE — Progress Notes (Unsigned)
VIRTUAL VISIT VIA VIDEO  I connected with Connie Knox on 08/12/22 at  4:00 PM EDT by a video enabled telemedicine application and verified that I am speaking with the correct person using two identifiers. Location patient: Home Location provider: [facility], Office Persons participating in the virtual visit: Patient, Dr. Raoul Pitch and Cyndra Numbers, CMA  I discussed the limitations of evaluation and management by telemedicine and the availability of in person appointments. The patient expressed understanding and agreed to proceed.     Connie Knox , Jul 23, 1961, 61 y.o., female MRN: [id number] Patient Care Team    Relationship Specialty Notifications Start End  Ma Hillock, DO PCP - General Family Medicine  06/30/18   Juluis Rainier  Optometry  07/03/18   Everlene Farrier, MD Consulting Physician Obstetrics and Gynecology  07/03/18   Richmond Campbell, MD Consulting Physician Gastroenterology  07/03/18     Chief Complaint  Patient presents with   Covid Positive    Pt c/o nasal congestion, cough x 5 days ; pt tested pos on 08/09/22     Subjective: Pt presents for an OV for COVID illness.  Patient reports she has had nasal congestion and cough for approximately 5 days.  She tested positive for COVID on 08/09/2022.  She states it feels as if she is producing a large amount of phlegm and the cough is keeping her up at night.  She is tolerating p.o.  No recent fevers or chills.  No shortness of breath.     04/14/2022    8:34 AM 10/13/2020    8:11 AM 06/30/2018    3:00 PM  Depression screen PHQ 2/9  Decreased Interest 0 0 0  Down, Depressed, Hopeless 0 0 0  PHQ - 2 Score 0 0 0    Allergies  Allergen Reactions   Aleve [Naproxen Sodium] Palpitations   Social History   Social History Narrative   Married.  2 children.   Bachelors degree.  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   Drinks alcohol socially.  Former smoker.   Exercises  routinely.   Eats dairy products.   Drinks caffeine.   Had smoke alarm in the home.   Wears her seatbelt.   Feels safe in her relationships.   Past Medical History:  Diagnosis Date   Adenomatous colon polyp    Arthritis    "knees" (05/12/2016)   BCC (basal cell carcinoma of skin) 12/2018   BCC mid-chest and shin   Chicken pox    Depression    Dyslipidemia    GERD (gastroesophageal reflux disease)    Hearing loss in right ear    chronic   Panic attack    Pubic ramus fracture ([place]) 05/11/2016   fell down carpeted steps.  Pubic ramus fracture, sacral fracture.   Past Surgical History:  Procedure Laterality Date   DILATION AND CURETTAGE OF UTERUS  X 9   "all miscarriages"   Family History  Problem Relation Age of Onset   Lung cancer Maternal Grandmother    Stroke Maternal Grandfather    Ulcers Maternal Grandfather    Breast cancer Paternal Grandmother    Stomach cancer Paternal Grandfather    Allergies as of 08/11/2022       Reactions   Aleve [naproxen Sodium] Palpitations        Medication List        Accurate as of August 11, 2022 11:59  PM. If you have any questions, ask your nurse or doctor.          benzonatate 200 MG capsule Commonly known as: TESSALON Take 1 capsule (200 mg total) by mouth 2 (two) times daily as needed for cough. Started by: Howard Pouch, DO   calcium carbonate 500 MG chewable tablet Commonly known as: TUMS - dosed in mg elemental calcium Chew 1 tablet by mouth daily.   Fish Oil 1000 MG Caps Fish Oil What changed: Another medication with the same name was removed. Continue taking this medication, and follow the directions you see here. Changed by: Howard Pouch, DO   HYDROcodone bit-homatropine 5-1.5 MG/5ML syrup Commonly known as: HYCODAN Take 5 mLs by mouth at bedtime. Started by: Howard Pouch, DO   molnupiravir EUA 200 mg Caps capsule Commonly known as: LAGEVRIO Take 4 capsules (800 mg total) by mouth 2 (two) times daily  for 5 days. Started by: Howard Pouch, DO   MULTI-VITAMIN DAILY PO Take 1 tablet by mouth daily.   pantoprazole 40 MG tablet Commonly known as: PROTONIX Take 1 tablet (40 mg total) by mouth daily.   Tart Cherry 1200 MG Caps Tart Cherry   VITAMIN D PO Take by mouth daily.        All past medical history, surgical history, allergies, family history, immunizations andmedications were updated in the EMR today and reviewed under the history and medication portions of their EMR.     ROS Negative, with the exception of above mentioned in HPI   Objective:  SpO2 96%  There is no height or weight on file to calculate BMI. Physical Exam Vitals and nursing note reviewed.  Constitutional:      General: She is not in acute distress.    Appearance: Normal appearance. She is normal weight. She is not ill-appearing or toxic-appearing.  HENT:     Head: Normocephalic and atraumatic.  Eyes:     General:        Right eye: No discharge.        Left eye: No discharge.     Extraocular Movements: Extraocular movements intact.     Conjunctiva/sclera: Conjunctivae normal.     Pupils: Pupils are equal, round, and reactive to light.  Pulmonary:     Effort: Pulmonary effort is normal.  Skin:    Findings: No rash.  Neurological:     Mental Status: She is alert and oriented to person, place, and time. Mental status is at baseline.  Psychiatric:        Mood and Affect: Mood normal.        Behavior: Behavior normal.        Thought Content: Thought content normal.        Judgment: Judgment normal.     No results found. No results found. No results found for this or any previous visit (from the past 24 hour(s)).  Assessment/Plan: Connie Knox is a 61 y.o. female present for OV for  COVID-19/Acute cough Rest, hydrate.  +/- flonase, mucinex (DM if cough), nettie pot or nasal saline.  Tessalon Perles and Hycodan cough syrup called in to help her rest and reduce cough. Molnupiravir  prescribed, take until completed.  If cough present it can last up to 8 weeks.  Follow-up in 2 weeks if symptoms are not resolved.   Reviewed expectations re: course of current medical issues. Discussed self-management of symptoms. Outlined signs and symptoms indicating need for more acute intervention. Patient verbalized understanding and all questions  were answered. Patient received an After-Visit Summary.    No orders of the defined types were placed in this encounter.  Meds ordered this encounter  Medications   molnupiravir EUA (LAGEVRIO) 200 mg CAPS capsule    Sig: Take 4 capsules (800 mg total) by mouth 2 (two) times daily for 5 days.    Dispense:  40 capsule    Refill:  0   benzonatate (TESSALON) 200 MG capsule    Sig: Take 1 capsule (200 mg total) by mouth 2 (two) times daily as needed for cough.    Dispense:  20 capsule    Refill:  0   HYDROcodone bit-homatropine (HYCODAN) 5-1.5 MG/5ML syrup    Sig: Take 5 mLs by mouth at bedtime.    Dispense:  60 mL    Refill:  0   Referral Orders  No referral(s) requested today     Note is dictated utilizing voice recognition software. Although note has been proof read prior to signing, occasional typographical errors still can be missed. If any questions arise, please do not hesitate to call for verification.   electronically signed by:  Howard Pouch, DO  [place]

## 2022-08-12 MED ORDER — HYDROCODONE BIT-HOMATROP MBR 5-1.5 MG/5ML PO SOLN: 5.0000 mL | Freq: Every day | ORAL | 0 refills | Status: DC

## 2022-09-24 DIAGNOSIS — L821 Other seborrheic keratosis: Secondary | ICD-10-CM | POA: Diagnosis not present

## 2022-09-24 DIAGNOSIS — D485 Neoplasm of uncertain behavior of skin: Secondary | ICD-10-CM | POA: Diagnosis not present

## 2022-09-24 DIAGNOSIS — L439 Lichen planus, unspecified: Secondary | ICD-10-CM | POA: Diagnosis not present

## 2023-04-20 DIAGNOSIS — Z1231 Encounter for screening mammogram for malignant neoplasm of breast: Secondary | ICD-10-CM | POA: Diagnosis not present

## 2023-04-20 DIAGNOSIS — Z6823 Body mass index (BMI) 23.0-23.9, adult: Secondary | ICD-10-CM | POA: Diagnosis not present

## 2023-04-20 DIAGNOSIS — Z124 Encounter for screening for malignant neoplasm of cervix: Secondary | ICD-10-CM | POA: Diagnosis not present

## 2023-04-20 DIAGNOSIS — Z01419 Encounter for gynecological examination (general) (routine) without abnormal findings: Secondary | ICD-10-CM | POA: Diagnosis not present

## 2023-04-20 LAB — HM PAP SMEAR

## 2023-06-30 ENCOUNTER — Other Ambulatory Visit: Payer: Self-pay

## 2023-06-30 NOTE — Telephone Encounter (Signed)
RF request for pantoprazole (PROTONIX) 40 MG tablet  LOV: 08/11/22  acute VV Next ov: N/A Last written: 04/14/22 (90,3)  Pt is overdue for appt, has not been seen in almost a year.

## 2023-07-11 ENCOUNTER — Ambulatory Visit: Payer: BC Managed Care – PPO | Admitting: Family Medicine

## 2023-07-11 ENCOUNTER — Encounter: Payer: Self-pay | Admitting: Family Medicine

## 2023-07-11 NOTE — Patient Instructions (Signed)
No follow-ups on file.        Great to see you today.  I have refilled the medication(s) we provide.   If labs were collected, we will inform you of lab results once received either by echart message or telephone call.   - echart message- for normal results that have been seen by the patient already.   - telephone call: abnormal results or if patient has not viewed results in their echart.  

## 2023-07-11 NOTE — Progress Notes (Unsigned)
    No show

## 2023-07-12 ENCOUNTER — Telehealth: Payer: Self-pay | Admitting: Family Medicine

## 2023-07-12 MED ORDER — PANTOPRAZOLE SODIUM 40 MG PO TBEC: 40.0000 mg | DELAYED_RELEASE_TABLET | Freq: Every day | ORAL | 0 refills | Status: DC

## 2023-07-12 NOTE — Telephone Encounter (Signed)
Patient is requesting temp refill on pantoprazole (PROTONIX) 40 MG tablet  until her appointment on 07/29. Her pharmacy is  [id number] - [place], [place] - [address] AT [facility] [address]

## 2023-07-12 NOTE — Telephone Encounter (Signed)
Rx sent 

## 2023-07-18 ENCOUNTER — Encounter: Payer: Self-pay | Admitting: Family Medicine

## 2023-07-18 ENCOUNTER — Ambulatory Visit: Payer: BC Managed Care – PPO | Admitting: Family Medicine

## 2023-07-18 VITALS — BP 135/79 | HR 49 | Temp 97.5°F | Ht 65.5 in | Wt 152.4 lb

## 2023-07-18 DIAGNOSIS — K219 Gastro-esophageal reflux disease without esophagitis: Secondary | ICD-10-CM

## 2023-07-18 DIAGNOSIS — Z79899 Other long term (current) drug therapy: Secondary | ICD-10-CM

## 2023-07-18 DIAGNOSIS — M19049 Primary osteoarthritis, unspecified hand: Secondary | ICD-10-CM | POA: Diagnosis not present

## 2023-07-18 DIAGNOSIS — E785 Hyperlipidemia, unspecified: Secondary | ICD-10-CM | POA: Diagnosis not present

## 2023-07-18 DIAGNOSIS — Z5181 Encounter for therapeutic drug level monitoring: Secondary | ICD-10-CM

## 2023-07-18 LAB — COMPREHENSIVE METABOLIC PANEL WITH GFR
ALT: 16 U/L (ref 0–35)
AST: 22 U/L (ref 0–37)
Albumin: 4.4 g/dL (ref 3.5–5.2)
Alkaline Phosphatase: 68 U/L (ref 39–117)
BUN: 17 mg/dL (ref 6–23)
CO2: 27 meq/L (ref 19–32)
Calcium: 9.6 mg/dL (ref 8.4–10.5)
Chloride: 102 meq/L (ref 96–112)
Creatinine, Ser: 0.88 mg/dL (ref 0.40–1.20)
GFR: 70.58 mL/min
Glucose, Bld: 84 mg/dL (ref 70–99)
Potassium: 4.3 meq/L (ref 3.5–5.1)
Sodium: 139 meq/L (ref 135–145)
Total Bilirubin: 0.5 mg/dL (ref 0.2–1.2)
Total Protein: 7 g/dL (ref 6.0–8.3)

## 2023-07-18 LAB — VITAMIN D 25 HYDROXY (VIT D DEFICIENCY, FRACTURES): VITD: 57.06 ng/mL (ref 30.00–100.00)

## 2023-07-18 LAB — CBC
HCT: 38.3 % (ref 36.0–46.0)
Hemoglobin: 12.7 g/dL (ref 12.0–15.0)
MCHC: 33.2 g/dL (ref 30.0–36.0)
MCV: 100.3 fl — ABNORMAL HIGH (ref 78.0–100.0)
Platelets: 292 10*3/uL (ref 150.0–400.0)
RBC: 3.82 Mil/uL — ABNORMAL LOW (ref 3.87–5.11)
RDW: 12.6 % (ref 11.5–15.5)
WBC: 6.2 10*3/uL (ref 4.0–10.5)

## 2023-07-18 LAB — B12 AND FOLATE PANEL
Folate: >23.8 ng/mL (ref >5.9)
Vitamin B-12: 202 pg/mL — ABNORMAL LOW (ref 211–911)

## 2023-07-18 LAB — MAGNESIUM: Magnesium: 2.1 mg/dL (ref 1.5–2.5)

## 2023-07-18 MED ORDER — PANTOPRAZOLE SODIUM 40 MG PO TBEC: 40.0000 mg | DELAYED_RELEASE_TABLET | Freq: Every day | ORAL | 1 refills | Status: DC

## 2023-07-18 NOTE — Patient Instructions (Addendum)
Return in about 24 weeks (around 01/02/2024) for cpe (20 min), Routine chronic condition follow-up.        Great to see you today.  I have refilled the medication(s) we provide.   If labs were collected or images ordered, we will inform you of  results once we have received them and reviewed. We will contact you either by echart message, or telephone call.  Please give ample time to the testing facility, and our office to run,  receive and review results. Please do not call inquiring of results, even if you can see them in your chart. We will contact you as soon as we are able. If it has been over 1 week since the test was completed, and you have not yet heard from [place], then please call us.    - echart message- for normal results that have been seen by the patient already.   - telephone call: abnormal results or if patient has not viewed results in their echart.  If a referral to a specialist was entered for you, please call us in 2 weeks if you have not heard from the specialist office to schedule.

## 2023-07-18 NOTE — Progress Notes (Signed)
Patient ID: Connie Knox, female  DOB: 05-19-1961, 62 y.o.   MRN: [id number] Patient Care Team    Relationship Specialty Notifications Start End  Natalia Leatherwood, DO PCP - General Family Medicine  06/30/18   Davina Poke  Optometry  07/03/18   Harold Hedge, MD Consulting Physician Obstetrics and Gynecology  07/03/18   Sharrell Ku, MD Consulting Physician Gastroenterology  07/03/18     Chief Complaint  Patient presents with   Gastroesophageal Reflux    Subjective: Connie Knox is a 62 y.o.  Female  present for All past medical history, surgical history, allergies, family history, immunizations, medications and social history were updated in the electronic medical record today. All recent labs, ED visits and hospitalizations within the last year were reviewed.  GERD: Patient reports her symptoms greatly improved with Protonix 40 mg daily.  She has been unable to wean off med w/o symptoms returning.      07/18/2023    1:34 PM 04/14/2022    8:34 AM 10/13/2020    8:11 AM 06/30/2018    3:00 PM  Depression screen PHQ 2/9  Decreased Interest 0 0 0 0  Down, Depressed, Hopeless 0 0 0 0  PHQ - 2 Score 0 0 0 0       No data to display           Immunization History  Administered Date(s) Administered   PFIZER(Purple Top)SARS-COV-2 Vaccination 02/28/2020, 03/24/2020, 10/10/2020   Tdap 01/12/2021   Zoster Recombinant(Shingrix) 01/16/2021, 02/20/2021    Past Medical History:  Diagnosis Date   Adenomatous colon polyp    Anxiety 10/04/2019   Arthritis    "knees" (05/12/2016)   BCC (basal cell carcinoma of skin) 12/2018   BCC mid-chest and shin   Chicken pox    Depression    Dyslipidemia    GERD (gastroesophageal reflux disease)    Hearing loss in right ear    chronic   Panic attack    Pubic ramus fracture ([facility]) 05/11/2016   fell down carpeted steps.  Pubic ramus fracture, sacral fracture.   Allergies  Allergen Reactions   Atorvastatin Other (See  Comments)   Ciprofloxacin Other (See Comments)   Aleve [Naproxen Sodium] Palpitations   Past Surgical History:  Procedure Laterality Date   DILATION AND CURETTAGE OF UTERUS  X 9   "all miscarriages"   Family History  Problem Relation Age of Onset   Lung cancer Maternal Grandmother    Stroke Maternal Grandfather    Ulcers Maternal Grandfather    Breast cancer Paternal Grandmother    Stomach cancer Paternal Grandfather    Social History   Social History Narrative   Married.  2 children.   Bachelors degree.  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   Drinks alcohol socially.  Former smoker.   Exercises routinely.   Eats dairy products.   Drinks caffeine.   Had smoke alarm in the home.   Wears her seatbelt.   Feels safe in her relationships.    Allergies as of 07/18/2023       Reactions   Atorvastatin Other (See Comments)   Ciprofloxacin Other (See Comments)   Aleve [naproxen Sodium] Palpitations        Medication List        Accurate as of July 18, 2023  1:56 PM. If you have any questions, ask your nurse or doctor.  STOP taking these medications    calcium carbonate 500 MG chewable tablet Commonly known as: TUMS - dosed in mg elemental calcium Stopped by: Felix Pacini   MULTI-VITAMIN DAILY PO Stopped by: Felix Pacini       TAKE these medications    Fish Oil 1000 MG Caps Fish Oil   pantoprazole 40 MG tablet Commonly known as: PROTONIX Take 1 tablet (40 mg total) by mouth daily.   Tart Cherry 1200 MG Caps Tart Cherry   VITAMIN D PO Take by mouth daily.        All past medical history, surgical history, allergies, family history, immunizations andmedications were updated in the EMR today and reviewed under the history and medication portions of their EMR.     No results found for this or any previous visit (from the past 2160 hour(s)).    ROS: 14 pt review of systems performed and negative (unless  mentioned in an HPI)  Objective: BP 135/79   Pulse (!) 49   Temp (!) 97.5 F (36.4 C)   Ht 5' 5.5" (1.664 m)   Wt 152 lb 6.4 oz (69.1 kg)   SpO2 100%   BMI 24.97 kg/m  Physical Exam Vitals and nursing note reviewed.  Constitutional:      General: She is not in acute distress.    Appearance: Normal appearance. She is not ill-appearing, toxic-appearing or diaphoretic.  HENT:     Head: Normocephalic and atraumatic.  Eyes:     General: No scleral icterus.       Right eye: No discharge.        Left eye: No discharge.     Extraocular Movements: Extraocular movements intact.     Conjunctiva/sclera: Conjunctivae normal.     Pupils: Pupils are equal, round, and reactive to light.  Cardiovascular:     Rate and Rhythm: Normal rate and regular rhythm.  Pulmonary:     Effort: Pulmonary effort is normal. No respiratory distress.     Breath sounds: Normal breath sounds. No wheezing, rhonchi or rales.  Skin:    General: Skin is warm.     Findings: No rash.  Neurological:     Mental Status: She is alert and oriented to person, place, and time. Mental status is at baseline.     Motor: No weakness.     Gait: Gait normal.  Psychiatric:        Mood and Affect: Mood normal.        Behavior: Behavior normal.        Thought Content: Thought content normal.        Judgment: Judgment normal.      No results found.  Assessment/plan: Connie Knox is a 62 y.o. female present for Chronic Conditions/illness Management Dyslipidemia Currently taking fish oil supplementation only. Encouraged heart healthy diet and routine exercise She would like to wait for cholesterol check (will schedule CPE as next appt)   Cbc, cmp collected Gastroesophageal reflux disease without esophagitis/long term PPI Stable Continue Protonix  40 mg daily Avoid any known triggers. B12, vitd, mag levels checked today for long term use Encouraged her to follow-up with her GI for her reflux since it is not as  well controlled    Return in about 24 weeks (around 01/02/2024) for cpe (20 min), Routine chronic condition follow-up.  Orders Placed This Encounter  Procedures   Comprehensive metabolic panel   CBC   Vitamin D (25 hydroxy)   Magnesium   B12 and Folate  Panel    Meds ordered this encounter  Medications   pantoprazole (PROTONIX) 40 MG tablet    Sig: Take 1 tablet (40 mg total) by mouth daily.    Dispense:  90 tablet    Refill:  1   Referral Orders  No referral(s) requested today     Electronically signed by: Felix Pacini, DO [facility] [place]

## 2023-07-19 NOTE — Progress Notes (Signed)
Pt given results/recommendations.

## 2023-08-30 DIAGNOSIS — M5416 Radiculopathy, lumbar region: Secondary | ICD-10-CM | POA: Diagnosis not present

## 2023-11-08 ENCOUNTER — Ambulatory Visit: Payer: BC Managed Care – PPO | Admitting: Family Medicine

## 2023-11-08 ENCOUNTER — Encounter: Payer: Self-pay | Admitting: Family Medicine

## 2023-11-08 VITALS — BP 132/72 | HR 59 | Temp 97.4°F | Wt 157.2 lb

## 2023-11-08 DIAGNOSIS — E538 Deficiency of other specified B group vitamins: Secondary | ICD-10-CM | POA: Insufficient documentation

## 2023-11-08 DIAGNOSIS — F410 Panic disorder [episodic paroxysmal anxiety] without agoraphobia: Secondary | ICD-10-CM | POA: Insufficient documentation

## 2023-11-08 DIAGNOSIS — K219 Gastro-esophageal reflux disease without esophagitis: Secondary | ICD-10-CM

## 2023-11-08 HISTORY — DX: Panic disorder (episodic paroxysmal anxiety): F41.0

## 2023-11-08 HISTORY — DX: Deficiency of other specified B group vitamins: E53.8

## 2023-11-08 MED ORDER — ESCITALOPRAM OXALATE 10 MG PO TABS: 10.0000 mg | ORAL_TABLET | Freq: Every day | ORAL | 1 refills | Status: DC

## 2023-11-08 MED ORDER — HYDROXYZINE HCL 10 MG PO TABS: 10.0000 mg | ORAL_TABLET | Freq: Three times a day (TID) | ORAL | 5 refills | Status: DC | PRN

## 2023-11-08 MED ORDER — FAMOTIDINE 40 MG PO TABS: 40.0000 mg | ORAL_TABLET | Freq: Every day | ORAL | 1 refills | Status: DC

## 2023-11-08 NOTE — Progress Notes (Signed)
Connie Knox , [birth year], 62 y.o., female MRN: [id number] Patient Care Team    Relationship Specialty Notifications Start End  Natalia Leatherwood, DO PCP - General Family Medicine  06/30/18   Davina Poke  Optometry  07/03/18   Harold Hedge, MD Consulting Physician Obstetrics and Gynecology  07/03/18   Sharrell Ku, MD Consulting Physician Gastroenterology  07/03/18     Chief Complaint  Patient presents with   Gastroesophageal Reflux    2 wks chest discomfort; GERD all day; slight HA     Subjective: Connie Knox is a 62 y.o. Pt presents for an OV with complaints of increased anxiety with panic attacks of 2 weeks duration. She reports her anxiety started the day of the election. She was feeling palpitations, "loopy", headache and she has had intermittent chest discomfort.  She states the symptoms are worse at night, because she is thinking more. She endorses fatigue. She states she is still active and working without issues of chest pain or dyspnea.  She has noticed she is experiencing more reflux symptoms, despite taking her Protonix daily. She has been prescribed diclofenac by ortho, she has been taking it for about 4-5 weeks.      11/08/2023    1:53 PM 11/08/2023    1:52 PM 07/18/2023    1:34 PM 04/14/2022    8:34 AM 10/13/2020    8:11 AM  Depression screen PHQ 2/9  Decreased Interest 0 0 0 0 0  Down, Depressed, Hopeless 0 0 0 0 0  PHQ - 2 Score 0 0 0 0 0      11/08/2023    1:53 PM  GAD 7 : Generalized Anxiety Score  Nervous, Anxious, on Edge 3  Control/stop worrying 0  Worry too much - different things 0  Trouble relaxing 3  Restless 0  Easily annoyed or irritable 0  Afraid - awful might happen 3  Total GAD 7 Score 9  Anxiety Difficulty Not difficult at all     Allergies  Allergen Reactions   Atorvastatin Other (See Comments)   Ciprofloxacin Other (See Comments)   Aleve [Naproxen Sodium] Palpitations   Social History   Social History  Narrative   Married.  2 children.   Bachelors degree.  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   Drinks alcohol socially.  Former smoker.   Exercises routinely.   Eats dairy products.   Drinks caffeine.   Had smoke alarm in the home.   Wears her seatbelt.   Feels safe in her relationships.   Past Medical History:  Diagnosis Date   Adenomatous colon polyp    Anxiety 10/04/2019   Arthritis    "knees" (05/12/2016)   BCC (basal cell carcinoma of skin) 12/2018   BCC mid-chest and shin   Chicken pox    Depression    Dyslipidemia    GERD (gastroesophageal reflux disease)    Hearing loss in right ear    chronic   Panic attack    Pubic ramus fracture (HCC) 05/11/2016   fell down carpeted steps.  Pubic ramus fracture, sacral fracture.   Past Surgical History:  Procedure Laterality Date   DILATION AND CURETTAGE OF UTERUS  X 9   "all miscarriages"   Family History  Problem Relation Age of Onset   Lung cancer Maternal Grandmother    Stroke Maternal Grandfather    Ulcers Maternal Grandfather    Breast cancer Paternal Grandmother  Stomach cancer Paternal Grandfather    Allergies as of 11/08/2023       Reactions   Atorvastatin Other (See Comments)   Ciprofloxacin Other (See Comments)   Aleve [naproxen Sodium] Palpitations        Medication List        Accurate as of November 08, 2023  2:21 PM. If you have any questions, ask your nurse or doctor.          diclofenac 75 MG EC tablet Commonly known as: VOLTAREN Take 75 mg by mouth daily.   escitalopram 10 MG tablet Commonly known as: Lexapro Take 1 tablet (10 mg total) by mouth daily. Started by: Felix Pacini   famotidine 40 MG tablet Commonly known as: Pepcid Take 1 tablet (40 mg total) by mouth at bedtime. Started by: Felix Pacini   Fish Oil 1000 MG Caps Fish Oil   hydrOXYzine 10 MG tablet Commonly known as: ATARAX Take 1 tablet (10 mg total) by mouth 3 (three) times daily as  needed. Started by: Felix Pacini   pantoprazole 40 MG tablet Commonly known as: PROTONIX Take 1 tablet (40 mg total) by mouth daily.   Tart Cherry 1200 MG Caps Tart Cherry   VITAMIN D PO Take by mouth daily.        All past medical history, surgical history, allergies, family history, immunizations andmedications were updated in the EMR today and reviewed under the history and medication portions of their EMR.     ROS Negative, with the exception of above mentioned in HPI   Objective:  BP 132/72   Pulse (!) 59   Temp (!) 97.4 F (36.3 C)   Wt 157 lb 3.2 oz (71.3 kg)   SpO2 98%   BMI 25.76 kg/m  Body mass index is 25.76 kg/m. Physical Exam Vitals and nursing note reviewed.  Constitutional:      General: She is not in acute distress.    Appearance: Normal appearance. She is normal weight. She is not ill-appearing or toxic-appearing.  HENT:     Head: Normocephalic and atraumatic.  Eyes:     General: No scleral icterus.       Right eye: No discharge.        Left eye: No discharge.     Extraocular Movements: Extraocular movements intact.     Conjunctiva/sclera: Conjunctivae normal.     Pupils: Pupils are equal, round, and reactive to light.  Neck:     Comments: No thyromegaly Cardiovascular:     Rate and Rhythm: Normal rate and regular rhythm.     Heart sounds: No murmur heard. Pulmonary:     Effort: Pulmonary effort is normal. No respiratory distress.     Breath sounds: Normal breath sounds. No wheezing, rhonchi or rales.  Musculoskeletal:     Cervical back: Neck supple.     Right lower leg: No edema.     Left lower leg: No edema.  Skin:    Findings: No rash.  Neurological:     Mental Status: She is alert and oriented to person, place, and time. Mental status is at baseline.     Motor: No weakness.     Coordination: Coordination normal.     Gait: Gait normal.  Psychiatric:        Mood and Affect: Mood normal.        Behavior: Behavior normal.         Thought Content: Thought content normal.        Judgment:  Judgment normal.     No results found. No results found. No results found for this or any previous visit (from the past 24 hour(s)).  Assessment/Plan: Connie Knox is a 62 y.o. female present for OV for  Severe anxiety with panic Onset was election day. She is stressing and anxious about the outcome.  Start vistaril 5 -10 mg every day for panic and sleep Start lexapro 10 mg every day.  F/u 6 mos, sooner if needed.   Gastroesophageal reflux disease without esophagitis She is having increased GERD sx. Possibly 2ndary to anxiety level vs diclofenac use.  Encouraged her to take diclofenac with food. She feels this med works great for her arthritis and would ike to continue Added pepcid at bedtime temporarily.    Reviewed expectations re: course of current medical issues. Discussed self-management of symptoms. Outlined signs and symptoms indicating need for more acute intervention. Patient verbalized understanding and all questions were answered. Patient received an After-Visit Summary.    No orders of the defined types were placed in this encounter.  Meds ordered this encounter  Medications   hydrOXYzine (ATARAX) 10 MG tablet    Sig: Take 1 tablet (10 mg total) by mouth 3 (three) times daily as needed.    Dispense:  90 tablet    Refill:  5   escitalopram (LEXAPRO) 10 MG tablet    Sig: Take 1 tablet (10 mg total) by mouth daily.    Dispense:  90 tablet    Refill:  1   famotidine (PEPCID) 40 MG tablet    Sig: Take 1 tablet (40 mg total) by mouth at bedtime.    Dispense:  90 tablet    Refill:  1   Referral Orders  No referral(s) requested today     Note is dictated utilizing voice recognition software. Although note has been proof read prior to signing, occasional typographical errors still can be missed. If any questions arise, please do not hesitate to call for verification.   electronically signed  by:  Felix Pacini, DO  [facility] [place]

## 2023-11-08 NOTE — Patient Instructions (Signed)

## 2023-11-09 ENCOUNTER — Encounter: Payer: Self-pay | Admitting: Family Medicine

## 2023-11-09 ENCOUNTER — Ambulatory Visit: Payer: BC Managed Care – PPO | Admitting: Family Medicine

## 2023-11-09 VITALS — BP 120/60 | HR 76 | Temp 98.0°F | Ht 66.0 in | Wt 155.8 lb

## 2023-11-09 DIAGNOSIS — D126 Benign neoplasm of colon, unspecified: Secondary | ICD-10-CM

## 2023-11-09 DIAGNOSIS — K219 Gastro-esophageal reflux disease without esophagitis: Secondary | ICD-10-CM | POA: Diagnosis not present

## 2023-11-09 DIAGNOSIS — E538 Deficiency of other specified B group vitamins: Secondary | ICD-10-CM

## 2023-11-09 DIAGNOSIS — Z0001 Encounter for general adult medical examination with abnormal findings: Secondary | ICD-10-CM

## 2023-11-09 DIAGNOSIS — Z23 Encounter for immunization: Secondary | ICD-10-CM

## 2023-11-09 DIAGNOSIS — T466X5A Adverse effect of antihyperlipidemic and antiarteriosclerotic drugs, initial encounter: Secondary | ICD-10-CM

## 2023-11-09 DIAGNOSIS — Z131 Encounter for screening for diabetes mellitus: Secondary | ICD-10-CM | POA: Diagnosis not present

## 2023-11-09 DIAGNOSIS — Z0000 Encounter for general adult medical examination without abnormal findings: Secondary | ICD-10-CM

## 2023-11-09 DIAGNOSIS — E785 Hyperlipidemia, unspecified: Secondary | ICD-10-CM

## 2023-11-09 DIAGNOSIS — G720 Drug-induced myopathy: Secondary | ICD-10-CM

## 2023-11-09 DIAGNOSIS — Z1231 Encounter for screening mammogram for malignant neoplasm of breast: Secondary | ICD-10-CM | POA: Diagnosis not present

## 2023-11-09 HISTORY — DX: Drug-induced myopathy: G72.0

## 2023-11-09 LAB — COMPREHENSIVE METABOLIC PANEL
ALT: 22 U/L (ref 0–35)
AST: 26 U/L (ref 0–37)
Albumin: 4.7 g/dL (ref 3.5–5.2)
Alkaline Phosphatase: 76 U/L (ref 39–117)
BUN: 17 mg/dL (ref 6–23)
CO2: 31 meq/L (ref 19–32)
Calcium: 9.9 mg/dL (ref 8.4–10.5)
Chloride: 101 meq/L (ref 96–112)
Creatinine, Ser: 0.85 mg/dL (ref 0.40–1.20)
GFR: 73.41 mL/min (ref >60.00)
Glucose, Bld: 78 mg/dL (ref 70–99)
Potassium: 5 meq/L (ref 3.5–5.1)
Sodium: 140 meq/L (ref 135–145)
Total Bilirubin: 0.4 mg/dL (ref 0.2–1.2)
Total Protein: 7.5 g/dL (ref 6.0–8.3)

## 2023-11-09 LAB — LIPID PANEL
Cholesterol: 321 mg/dL — ABNORMAL HIGH (ref 0–200)
HDL: 71.7 mg/dL (ref >39.00)
LDL Cholesterol: 191 mg/dL — ABNORMAL HIGH (ref 0–99)
NonHDL: 248.97
Total CHOL/HDL Ratio: 4
Triglycerides: 291 mg/dL — ABNORMAL HIGH (ref 0.0–149.0)
VLDL: 58.2 mg/dL — ABNORMAL HIGH (ref 0.0–40.0)

## 2023-11-09 LAB — CBC
HCT: 42.8 % (ref 36.0–46.0)
Hemoglobin: 14.2 g/dL (ref 12.0–15.0)
MCHC: 33.2 g/dL (ref 30.0–36.0)
MCV: 101.8 fL — ABNORMAL HIGH (ref 78.0–100.0)
Platelets: 303 10*3/uL (ref 150.0–400.0)
RBC: 4.2 Mil/uL (ref 3.87–5.11)
RDW: 13.3 % (ref 11.5–15.5)
WBC: 5.7 10*3/uL (ref 4.0–10.5)

## 2023-11-09 LAB — B12 AND FOLATE PANEL
Folate: 13.7 ng/mL (ref >5.9)
Vitamin B-12: >1537 pg/mL — ABNORMAL HIGH (ref 211–911)

## 2023-11-09 LAB — TSH: TSH: 2.19 u[IU]/mL (ref 0.35–5.50)

## 2023-11-09 LAB — HEMOGLOBIN A1C: Hgb A1c MFr Bld: 5.3 % (ref 4.6–6.5)

## 2023-11-09 MED ORDER — PANTOPRAZOLE SODIUM 40 MG PO TBEC: 40.0000 mg | DELAYED_RELEASE_TABLET | Freq: Every day | ORAL | 3 refills | Status: DC

## 2023-11-09 NOTE — Patient Instructions (Addendum)
Return in about 1 year (around 11/09/2024) for cpe (20 min).        Great to see you today.  I have refilled the medication(s) we provide.   If labs were collected or images ordered, we will inform you of  results once we have received them and reviewed. We will contact you either by echart message, or telephone call.  Please give ample time to the testing facility, and our office to run,  receive and review results. Please do not call inquiring of results, even if you can see them in your chart. We will contact you as soon as we are able. If it has been over 1 week since the test was completed, and you have not yet heard from [place], then please call us.    - echart message- for normal results that have been seen by the patient already.   - telephone call: abnormal results or if patient has not viewed results in their echart.  If a referral to a specialist was entered for you, please call us in 2 weeks if you have not heard from the specialist office to schedule.

## 2023-11-09 NOTE — Progress Notes (Signed)
Patient ID: Connie Knox, female  DOB: 03-14-1961, 62 y.o.   MRN: [id number] Patient Care Team    Relationship Specialty Notifications Start End  Natalia Leatherwood, DO PCP - General Family Medicine  06/30/18   Davina Poke  Optometry  07/03/18   Harold Hedge, MD Consulting Physician Obstetrics and Gynecology  07/03/18   Sharrell Ku, MD Consulting Physician Gastroenterology  07/03/18     Chief Complaint  Patient presents with   Annual Exam    Pt is fasting,     Subjective: Connie Knox is a 62 y.o.  Female  present for CPE and combined appointment for chronic conditions All past medical history, surgical history, allergies, family history, immunizations, medications and social history were updated in the electronic medical record today. All recent labs, ED visits and hospitalizations within the last year were reviewed.  Health maintenance:  Colonoscopy: completed by Dr. Kinnie Scales, colon polyps-12/20/2021, 5-year follow-up Mammogram/cervical cancer screening: completed: By gynecology.  Records requested Immunizations: tdap UTD 12/2020, Influenza declined (encouraged yearly), Shingrix series completed Infectious disease screening: HIV completed, Hep C completed  DEXA: Patient reports she had her DEXA completed with gynecology.   Assistive device: None Oxygen use: None Patient has a Dental home. Hospitalizations/ED visits: Reviewed  GERD: Patient reports compliance with Protonix 40 mg daily.    Dyslipidemia/statin myopathy: Patient has had statin myopathy in the past to Lipitor.  She has been working on exercise and dietary changes.  If needed she is willing to try different statin.     11/08/2023    1:53 PM 11/08/2023    1:52 PM 07/18/2023    1:34 PM 04/14/2022    8:34 AM 10/13/2020    8:11 AM  Depression screen PHQ 2/9  Decreased Interest 0 0 0 0 0  Down, Depressed, Hopeless 0 0 0 0 0  PHQ - 2 Score 0 0 0 0 0      11/08/2023    1:53 PM  GAD 7 : Generalized  Anxiety Score  Nervous, Anxious, on Edge 3  Control/stop worrying 0  Worry too much - different things 0  Trouble relaxing 3  Restless 0  Easily annoyed or irritable 0  Afraid - awful might happen 3  Total GAD 7 Score 9  Anxiety Difficulty Not difficult at all     Immunization History  Administered Date(s) Administered   PFIZER(Purple Top)SARS-COV-2 Vaccination 02/28/2020, 03/24/2020, 10/10/2020   Tdap 01/12/2021   Zoster Recombinant(Shingrix) 01/16/2021, 02/20/2021    Past Medical History:  Diagnosis Date   Adenomatous colon polyp    Anxiety 10/04/2019   Arthritis    "knees" (05/12/2016)   BCC (basal cell carcinoma of skin) 12/2018   BCC mid-chest and shin   Chicken pox    Depression    Dyslipidemia    GERD (gastroesophageal reflux disease)    Hearing loss in right ear    chronic   Panic attack    Pubic ramus fracture (HCC) 05/11/2016   fell down carpeted steps.  Pubic ramus fracture, sacral fracture.   Allergies  Allergen Reactions   Atorvastatin Other (See Comments)   Ciprofloxacin Other (See Comments)   Aleve [Naproxen Sodium] Palpitations   Past Surgical History:  Procedure Laterality Date   DILATION AND CURETTAGE OF UTERUS  X 9   "all miscarriages"   Family History  Problem Relation Age of Onset   Lung cancer Maternal Grandmother    Stroke Maternal Grandfather    Ulcers Maternal Grandfather  Breast cancer Paternal Grandmother    Stomach cancer Paternal Grandfather    Social History   Social History Narrative   Married.  2 children.   Bachelors degree.  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   Drinks alcohol socially.  Former smoker.   Exercises routinely.   Eats dairy products.   Drinks caffeine.   Had smoke alarm in the home.   Wears her seatbelt.   Feels safe in her relationships.    Allergies as of 11/09/2023       Reactions   Atorvastatin Other (See Comments)   Ciprofloxacin Other (See Comments)   Aleve  [naproxen Sodium] Palpitations        Medication List        Accurate as of November 09, 2023  9:18 AM. If you have any questions, ask your nurse or doctor.          diclofenac 75 MG EC tablet Commonly known as: VOLTAREN Take 75 mg by mouth daily.   escitalopram 10 MG tablet Commonly known as: Lexapro Take 1 tablet (10 mg total) by mouth daily.   famotidine 40 MG tablet Commonly known as: Pepcid Take 1 tablet (40 mg total) by mouth at bedtime.   Fish Oil 1000 MG Caps Fish Oil   hydrOXYzine 10 MG tablet Commonly known as: ATARAX Take 1 tablet (10 mg total) by mouth 3 (three) times daily as needed.   pantoprazole 40 MG tablet Commonly known as: PROTONIX Take 1 tablet (40 mg total) by mouth daily.   Tart Cherry 1200 MG Caps Tart Cherry   VITAMIN D PO Take by mouth daily.        All past medical history, surgical history, allergies, family history, immunizations andmedications were updated in the EMR today and reviewed under the history and medication portions of their EMR.     No results found for this or any previous visit (from the past 2160 hour(s)).    ROS: 14 pt review of systems performed and negative (unless mentioned in an HPI)  Objective: BP 120/60   Pulse 76   Temp 98 F (36.7 C)   Ht 5\' 6"  (1.676 m)   Wt 155 lb 12.8 oz (70.7 kg)   SpO2 97%   BMI 25.15 kg/m  Physical Exam Vitals and nursing note reviewed.  Constitutional:      General: She is not in acute distress.    Appearance: Normal appearance. She is not ill-appearing or toxic-appearing.  HENT:     Head: Normocephalic and atraumatic.     Right Ear: Tympanic membrane, ear canal and external ear normal. There is no impacted cerumen.     Left Ear: Tympanic membrane, ear canal and external ear normal. There is no impacted cerumen.     Nose: No congestion or rhinorrhea.     Mouth/Throat:     Mouth: Mucous membranes are moist.     Pharynx: Oropharynx is clear. No oropharyngeal  exudate or posterior oropharyngeal erythema.  Eyes:     General: No scleral icterus.       Right eye: No discharge.        Left eye: No discharge.     Extraocular Movements: Extraocular movements intact.     Conjunctiva/sclera: Conjunctivae normal.     Pupils: Pupils are equal, round, and reactive to light.  Cardiovascular:     Rate and Rhythm: Normal rate and regular rhythm.     Pulses: Normal pulses.  Heart sounds: Normal heart sounds. No murmur heard.    No friction rub. No gallop.  Pulmonary:     Effort: Pulmonary effort is normal. No respiratory distress.     Breath sounds: Normal breath sounds. No stridor. No wheezing, rhonchi or rales.  Chest:     Chest wall: No tenderness.  Abdominal:     General: Abdomen is flat. Bowel sounds are normal. There is no distension.     Palpations: Abdomen is soft. There is no mass.     Tenderness: There is no abdominal tenderness. There is no right CVA tenderness, left CVA tenderness, guarding or rebound.     Hernia: No hernia is present.  Musculoskeletal:        General: No swelling, tenderness or deformity. Normal range of motion.     Cervical back: Normal range of motion and neck supple. No rigidity or tenderness.     Right lower leg: No edema.     Left lower leg: No edema.  Lymphadenopathy:     Cervical: No cervical adenopathy.  Skin:    General: Skin is warm and dry.     Coloration: Skin is not jaundiced or pale.     Findings: No bruising, erythema, lesion or rash.  Neurological:     General: No focal deficit present.     Mental Status: She is alert and oriented to person, place, and time. Mental status is at baseline.     Cranial Nerves: No cranial nerve deficit.     Sensory: No sensory deficit.     Motor: No weakness.     Coordination: Coordination normal.     Gait: Gait normal.     Deep Tendon Reflexes: Reflexes normal.  Psychiatric:        Mood and Affect: Mood normal.        Behavior: Behavior normal.        Thought  Content: Thought content normal.        Judgment: Judgment normal.     No results found.  Assessment/plan: Connie Knox is a 63 y.o. female present for CPE and management of chronic conditions combined appointment Dyslipidemia Currently taking fish oil supplementation only. Encouraged heart healthy diet and routine exercise -Lipid panel, a1c Had statin myalgia to Lipitor, if needed she is willing to try a different one preferably the 1 her husband is using.  Gastroesophageal reflux disease without esophagitis Continue Protonix 40 mg daily Pepcid nightly as needed Cautioned on use of Voltaren prescribed by orthopedics-can cause worsening reflux Adenomatous polyp of colon, unspecified part of colon UTD B12 deficiency - B12 and Folate Panel Influenza vaccine needed declined Diabetes mellitus screening - Hemoglobin A1c Routine general medical examination at a health care facility - CBC - Comprehensive metabolic panel - Hemoglobin A1c - TSH Patient was encouraged to exercise greater than 150 minutes a week. Patient was encouraged to choose a diet filled with fresh fruits and vegetables, and lean meats. AVS provided to patient today for education/recommendation on gender specific health and safety maintenance. Colonoscopy: completed by Dr. Kinnie Scales, colon polyps-12/20/2021, 5-year follow-up Mammogram/cervical cancer screening: completed: By gynecology.  Records requested Immunizations: tdap UTD 12/2020, Influenza declined (encouraged yearly), Shingrix series completed Infectious disease screening: HIV completed, Hep C completed  DEXA: Patient reports she had her DEXA completed with gynecology.   Return in about 1 year (around 11/09/2024) for cpe (20 min).  Orders Placed This Encounter  Procedures   CBC   Comprehensive metabolic panel   Hemoglobin A1c  TSH   Lipid panel   B12 and Folate Panel    Meds ordered this encounter  Medications   pantoprazole (PROTONIX) 40 MG  tablet    Sig: Take 1 tablet (40 mg total) by mouth daily.    Dispense:  90 tablet    Refill:  3   Referral Orders  No referral(s) requested today    Electronically signed by: Felix Pacini, DO [facility] [place]

## 2023-11-11 ENCOUNTER — Telehealth: Payer: Self-pay | Admitting: Family Medicine

## 2023-11-11 MED ORDER — PRAVASTATIN SODIUM 40 MG PO TABS: 40.0000 mg | ORAL_TABLET | Freq: Every day | ORAL | 3 refills | Status: DC

## 2023-11-11 NOTE — Telephone Encounter (Signed)
These questions for addressed in a separate phone note with patient's results.

## 2023-11-11 NOTE — Telephone Encounter (Signed)
Please inform patient the following information: Liver function, kidney function and thyroid function are normal. Blood cell counts are stable. A1c/diabetes screening is normal. The elevation in the MCV/size of the blood cell is secondary to the elevated cholesterol levels in her blood. B12 levels are well supplemented.  Could go every other day with the B12 supplementation.  B12 is not toxic and the extra B12 is urinated out of the body daily.  Therefore, there is no concerns at the higher end level. Cholesterol levels are significantly elevated, more so the last time.  I have called in a statin, as we discussed during her office visit it is pravastatin and is taken before bed. I would recommend follow-up in 8-12 weeks for her hyperlipidemia so that we can recheck her cholesterol and liver functions at that appointment.

## 2023-11-11 NOTE — Telephone Encounter (Signed)
Results and recommendations given to pt.

## 2024-01-30 ENCOUNTER — Other Ambulatory Visit: Payer: Self-pay

## 2024-01-30 ENCOUNTER — Emergency Department (HOSPITAL_COMMUNITY)
Admission: EM | Admit: 2024-01-30 | Discharge: 2024-01-30 | Disposition: A | Discharge status: Home / Self Care | Payer: BC Managed Care – PPO | Attending: Emergency Medicine | Admitting: Emergency Medicine

## 2024-01-30 ENCOUNTER — Ambulatory Visit: Payer: Self-pay | Admitting: Family Medicine

## 2024-01-30 ENCOUNTER — Emergency Department (HOSPITAL_COMMUNITY): Payer: BC Managed Care – PPO

## 2024-01-30 DIAGNOSIS — R252 Cramp and spasm: Secondary | ICD-10-CM | POA: Insufficient documentation

## 2024-01-30 DIAGNOSIS — R42 Dizziness and giddiness: Secondary | ICD-10-CM | POA: Insufficient documentation

## 2024-01-30 DIAGNOSIS — R55 Syncope and collapse: Secondary | ICD-10-CM | POA: Diagnosis not present

## 2024-01-30 LAB — BASIC METABOLIC PANEL
Anion gap: 11 (ref 5–15)
BUN: 22 mg/dL (ref 8–23)
CO2: 25 mmol/L (ref 22–32)
Calcium: 9.5 mg/dL (ref 8.9–10.3)
Chloride: 104 mmol/L (ref 98–111)
Creatinine, Ser: 1.05 mg/dL — ABNORMAL HIGH (ref 0.44–1.00)
GFR, Estimated: >60 mL/min (ref >60)
Glucose, Bld: 113 mg/dL — ABNORMAL HIGH (ref 70–99)
Potassium: 4 mmol/L (ref 3.5–5.1)
Sodium: 140 mmol/L (ref 135–145)

## 2024-01-30 LAB — URINALYSIS, ROUTINE W REFLEX MICROSCOPIC
Bilirubin Urine: NEGATIVE
Glucose, UA: NEGATIVE mg/dL
Hgb urine dipstick: NEGATIVE
Ketones, ur: NEGATIVE mg/dL
Leukocytes,Ua: NEGATIVE
Nitrite: NEGATIVE
Protein, ur: NEGATIVE mg/dL
Specific Gravity, Urine: 1.004 — ABNORMAL LOW (ref 1.005–1.030)
pH: 7 (ref 5.0–8.0)

## 2024-01-30 LAB — CBC
HCT: 41.3 % (ref 36.0–46.0)
Hemoglobin: 13.8 g/dL (ref 12.0–15.0)
MCH: 33 pg (ref 26.0–34.0)
MCHC: 33.4 g/dL (ref 30.0–36.0)
MCV: 98.8 fL (ref 80.0–100.0)
Platelets: 282 10*3/uL (ref 150–400)
RBC: 4.18 MIL/uL (ref 3.87–5.11)
RDW: 12.1 % (ref 11.5–15.5)
WBC: 4.7 10*3/uL (ref 4.0–10.5)
nRBC: 0 % (ref 0.0–0.2)

## 2024-01-30 LAB — CBG MONITORING, ED: Glucose-Capillary: 101 mg/dL — ABNORMAL HIGH (ref 70–99)

## 2024-01-30 LAB — MAGNESIUM: Magnesium: 2.2 mg/dL (ref 1.7–2.4)

## 2024-01-30 LAB — CK: Total CK: 88 U/L (ref 38–234)

## 2024-01-30 MED ORDER — LORAZEPAM 1 MG PO TABS
1.0000 mg | ORAL_TABLET | Freq: Once | ORAL | Status: AC
  Administered 2024-01-30: 1 mg via ORAL
  Filled 2024-01-30: qty 1

## 2024-01-30 MED ORDER — MECLIZINE HCL 25 MG PO TABS: 25.0000 mg | ORAL_TABLET | Freq: Three times a day (TID) | ORAL | 0 refills | Status: AC | PRN

## 2024-01-30 MED ORDER — MECLIZINE HCL 25 MG PO TABS
25.0000 mg | ORAL_TABLET | Freq: Once | ORAL | Status: AC
  Administered 2024-01-30: 25 mg via ORAL
  Filled 2024-01-30: qty 1

## 2024-01-30 NOTE — ED Provider Notes (Signed)
  Physical Exam  BP 112/73 (BP Location: Right Arm)   Pulse 62   Temp 98.4 F (36.9 C)   Resp 18   Ht 5\' 6"  (1.676 m)   Wt 71.2 kg   SpO2 99%   BMI 25.34 kg/m   Physical Exam  Procedures  Procedures  ED Course / MDM   Clinical Course as of 01/30/24 1820  Mon Jan 30, 2024  1153 Slight increased Cr, otherwise labs within normal range. CK pending. [VK]  1321 I spoke with lab - CK machine is down, should be another hour for results. Orthostatics are within normal range. [VK]  1521 Patient signed out to Dr. Florie Husband pending CK level, likely plan for discharge home if normal. [VK]    Clinical Course User Index [VK] Kingsley, Victoria K, DO   Medical Decision Making Amount and/or Complexity of Data Reviewed Labs: ordered. Radiology: ordered.  Risk Prescription drug management.   Melody Spurling, assumed care for this patient.  In brief this is a 63 year old female who presented to the emergency room today due to dizziness.  Patient reported feeling "drunk", no other symptoms I believe MRI is prudent.  MRI fortunately did not show any posterior CVA.  Symptoms are feeling a bit better with meclizine .  Will discharge, have her follow-up with her PCP.      Afton Horse T, DO 01/30/24 Jerre Moots

## 2024-01-30 NOTE — ED Triage Notes (Signed)
 Pt. Stated, I woke up this morning with dizziness but I've been taken Mg Citrate Calmm for muscle cramps and I think I took too much last night. I called my Dr. And they said to come here.

## 2024-01-30 NOTE — ED Provider Notes (Signed)
 [place] [facility] Provider Note   CSN: [id number] Arrival date & time: 01/30/24  9528     History  Chief Complaint  Patient presents with   Dizziness   leg cramps    Connie Knox is a 63 y.o. female.  Patient is a 63 year old female with a past medical history of GERD and hyperlipidemia presenting to the emergency department with dizziness.  The patient states that she has been having leg cramps for the last several months and that her physical therapist had recommended her trying a "magnesium calm supplement" she states that she has been taking it the last 3 nights which has been helping with the cramping but states that she took more than usual last night and she woke up around 5 AM this morning with dizziness.  She states that she feels lightheaded as well as off balance.  She denies any room spinning.  She denies any recent nausea, vomiting or diarrhea.  She states that she has no numbness or weakness.  The history is provided by the patient.  Dizziness      Home Medications Prior to Admission medications   Medication Sig Start Date End Date Taking? Authorizing Provider  Cyanocobalamin  (VITAMIN B-12) 5000 MCG TBDP Take 4 drops by mouth every morning. Fill the dropper to 1/29mls and it makes 4 drops   Yes [provider]  diclofenac (VOLTAREN) 75 MG EC tablet Take 75 mg by mouth at bedtime. 09/26/23  Yes [provider]  hydrOXYzine  (ATARAX ) 10 MG tablet Take 1 tablet (10 mg total) by mouth 3 (three) times daily as needed. 11/08/23  Yes Kuneff, Renee A, DO  MAGNESIUM PO Take 350 mg by mouth at bedtime. Drink 1/2 teaspoonful   Yes [provider]  Omega-3 Fatty Acids (FISH OIL) 1000 MG CAPS Take 1,000 mg by mouth at bedtime.   Yes [provider]  pantoprazole  (PROTONIX ) 40 MG tablet Take 1 tablet (40 mg total) by mouth daily. 11/09/23  Yes Kuneff, Renee A, DO  pravastatin  (PRAVACHOL ) 40 MG tablet Take  1 tablet (40 mg total) by mouth at bedtime. 11/11/23  Yes Kuneff, Renee A, DO  Tart Cherry 500 MG CAPS Take 2 capsules by mouth at bedtime as needed.   Yes [provider]  VITAMIN D  PO Take 50 mcg by mouth at bedtime.   Yes [provider]  escitalopram  (LEXAPRO ) 10 MG tablet Take 1 tablet (10 mg total) by mouth daily. Patient not taking: Reported on 01/30/2024 11/08/23   Napolean Backbone A, DO      Allergies    Atorvastatin, Ciprofloxacin, and Aleve  [naproxen  sodium]    Review of Systems   Review of Systems  Neurological:  Positive for dizziness.    Physical Exam Updated Vital Signs BP (!) 107/96   Pulse (!) 56   Temp 98.5 F (36.9 C)   Resp 17   Ht 5\' 6"  (1.676 m)   Wt 71.2 kg   SpO2 100%   BMI 25.34 kg/m  Physical Exam Vitals and nursing note reviewed.  Constitutional:      General: She is not in acute distress.    Appearance: Normal appearance.  HENT:     Head: Normocephalic and atraumatic.     Nose: Nose normal.     Mouth/Throat:     Mouth: Mucous membranes are moist.     Pharynx: Oropharynx is clear.  Eyes:     Extraocular Movements: Extraocular movements intact.  Conjunctiva/sclera: Conjunctivae normal.     Pupils: Pupils are equal, round, and reactive to light.     Comments: No nystagmus  Cardiovascular:     Rate and Rhythm: Normal rate and regular rhythm.     Heart sounds: Normal heart sounds.  Pulmonary:     Effort: Pulmonary effort is normal.     Breath sounds: Normal breath sounds.  Abdominal:     General: Abdomen is flat.     Palpations: Abdomen is soft.     Tenderness: There is no abdominal tenderness.  Musculoskeletal:        General: Normal range of motion.     Cervical back: Normal range of motion.  Skin:    General: Skin is warm and dry.  Neurological:     General: No focal deficit present.     Mental Status: She is alert and oriented to person, place, and time.     Cranial Nerves: No cranial nerve deficit.     Sensory:  No sensory deficit.     Motor: No weakness.     Coordination: Coordination normal.  Psychiatric:        Mood and Affect: Mood normal.        Behavior: Behavior normal.     ED Results / Procedures / Treatments   Labs (all labs ordered are listed, but only abnormal results are displayed) Labs Reviewed  BASIC METABOLIC PANEL - Abnormal; Notable for the following components:      Result Value   Glucose, Bld 113 (*)    Creatinine, Ser 1.05 (*)    All other components within normal limits  URINALYSIS, ROUTINE W REFLEX MICROSCOPIC - Abnormal; Notable for the following components:   Color, Urine COLORLESS (*)    Specific Gravity, Urine 1.004 (*)    All other components within normal limits  CBG MONITORING, ED - Abnormal; Notable for the following components:   Glucose-Capillary 101 (*)    All other components within normal limits  CBC  MAGNESIUM  CK    EKG EKG Interpretation Date/Time:  Monday January 30 2024 09:40:05 EST Ventricular Rate:  71 PR Interval:  150 QRS Duration:  84 QT Interval:  410 QTC Calculation: 445 R Axis:   74  Text Interpretation: Normal sinus rhythm Cannot rule out Anterior infarct , age undetermined T wave abnormality, consider inferior ischemia Abnormal ECG When compared with ECG of 03-Oct-2020 18:53, No significant change since last tracing Confirmed by Racheal Buddle [phone]) on 01/30/2024 9:42:40 AM  Radiology No results found.  Procedures Procedures    Medications Ordered in ED Medications - No data to display  ED Course/ Medical Decision Making/ A&P Clinical Course as of 01/30/24 1524  Mon Jan 30, 2024  1153 Slight increased Cr, otherwise labs within normal range. CK pending. [VK]  1321 I spoke with lab - CK machine is down, should be another hour for results. Orthostatics are within normal range. [VK]  1521 Patient signed out to Dr. Florie Husband pending CK level, likely plan for discharge home if normal. [VK]    Clinical Course User  Index [VK] Kingsley, Mackenzy Grumbine K, DO                                 Medical Decision Making This patient presents to the ED with chief complaint(s) of dizziness with pertinent past medical history of hyperlipidemia, GERD which further complicates the presenting complaint. The complaint involves an extensive  differential diagnosis and also carries with it a high risk of complications and morbidity.    The differential diagnosis includes arrhythmia, anemia, dehydration, electrolyte abnormality, rhabdo, no nystagmus or neurologic deficits making a central peripheral vertigo unlikely, orthostatic hypotension  Additional history obtained: Additional history obtained from N/A Records reviewed Primary Care Documents  ED Course and Reassessment: On patient's arrival she is hemodynamically stable in no acute distress.  EKG on arrival showed normal sinus rhythm without acute ischemic changes.  Accu-Chek on arrival was within normal range.  The patient has no neurologic deficits on exam.  The patient will of labs including electrolytes and CK as well as orthostatic vital signs performed and she will be closely reassessed.  Independent labs interpretation:  The following labs were independently interpreted: within normal range, CK pending  Independent visualization of imaging: - n/A     Amount and/or Complexity of Data Reviewed Labs: ordered.          Final Clinical Impression(s) / ED Diagnoses Final diagnoses:  Dizziness  Muscle cramps    Rx / DC Orders ED Discharge Orders     None         Kingsley, Chrishana Spargur K, DO 01/30/24 1524

## 2024-01-30 NOTE — ED Notes (Signed)
 Patient transported to MRI

## 2024-01-30 NOTE — Telephone Encounter (Signed)
  Chief Complaint: dizziness (vertigo) Symptoms: dizziness, nausea, slightly blurry vision. Frequency: today since 72 Pertinent Negatives: Patient denies headache, chest pain, SOB, lightheaded, weakness, numbness, changes in speech, facial droop. Disposition: [x] ED /[] Urgent Care (no appt availability in office) / [] Appointment(In office/virtual)/ []  [facility]/ [] Home Care/ [] Refused Recommended Disposition /[] [facility]/ []  Follow-up with PCP Additional Notes: Patient states she felt baseline, last known normal last night before she went to bed and states she made herself her [employer] brand magnesium citrate drink. She states her friend recommended it due to she has been having leg muscle cramps. She states she took more than the recommended 1 teaspoon and woke up around 0545 this AM with dizziness. Patient agreeable to have someone drive her to ED this morning.   Copied from CRM [id number]. Topic: Clinical - Red Word Triage >> Jan 30, 2024  8:05 AM Howard Macho wrote: Red Word that prompted transfer to Nurse Triage: patient called stating she took magnesium citrate calm and she think she took to much and now she is really dizzy Reason for Disposition  [1] Dizziness (vertigo) present now AND [2] age > 17   (Exception: Prior doctor or NP/PA evaluation for this AND no different/worse than usual.)  Answer Assessment - Initial Assessment Questions 1. DESCRIPTION: "Describe your dizziness."     "Right now I feel, I can't describe this feeling I've never had it." Patient states she feels off center while sitting and stumbles left and right when she gets up. "I feel really drunk" and denies drinking any alcohol.  2. VERTIGO: "Do you feel like either you or the room is spinning or tilting?"      Like the room is spinning.  3. LIGHTHEADED: "Do you feel lightheaded?" (e.g., somewhat faint, woozy, weak upon standing)     Denies.  4. SEVERITY: "How bad is it?"  "Can you walk?"   -  MILD: Feels slightly dizzy and unsteady, but is walking normally.   - MODERATE: Feels unsteady when walking, but not falling; interferes with normal activities (e.g., school, work).   - SEVERE: Unable to walk without falling, or requires assistance to walk without falling.     Moderate; feels unsteady walking.  5. ONSET:  "When did the dizziness begin?"     05:45 this morning.  6. AGGRAVATING FACTORS: "Does anything make it worse?" (e.g., standing, change in head position)     Standing, moving her head.  7. CAUSE: "What do you think is causing the dizziness?"     Patient states she has been taking an OTC magnesium citrate supplement and took too much last night, she states she feels like this is related.  8. RECURRENT SYMPTOM: "Have you had dizziness before?" If Yes, ask: "When was the last time?" "What happened that time?"     Denies history of this.  9. OTHER SYMPTOMS: "Do you have any other symptoms?" (e.g., headache, weakness, numbness, vomiting, earache)     Nausea, slightly blurry vision.  Protocols used: Dizziness - Vertigo-A-AH

## 2024-01-30 NOTE — Discharge Instructions (Addendum)
 You were seen in the emergency department for your dizziness.  Your workup showed no signs of severe dehydration or abnormalities with your electrolytes.  Your dizziness is likely related to taking too much of the magnesium supplement.  You can take a low dose of this to help with your muscle cramps but I would not take more than the lowest recommended dose to prevent recurrent episodes of dizziness.  You can follow-up with your primary doctor to have your symptoms rechecked.  You should return to the emergency department for worsening dizziness and you pass out, you are unable to walk or if you have any other new or concerning symptoms.

## 2024-02-01 ENCOUNTER — Ambulatory Visit: Payer: Self-pay | Admitting: Family Medicine

## 2024-02-01 NOTE — Telephone Encounter (Signed)
Called [facility] to advise them of patient possibly refusing to go back to the ER.  No answer.  Office currently closed per [facility] voicemail

## 2024-02-01 NOTE — Telephone Encounter (Signed)
Called and spoke with pt. Pt scheduled for 2/14.

## 2024-02-01 NOTE — Telephone Encounter (Signed)
Copied from CRM [id number]. Topic: Clinical - Red Word Triage >> Feb 01, 2024 11:47 AM Theodis Sato wrote: Red Word that prompted transfer to Nurse Triage: Patient is still dizzy and was hospitalized for dizziness on 2/10 - was prescribed Dramamine for inner infection and is concerned.   Chief Complaint: Dizziness Symptoms: dizziness Frequency: Monday morning at 5:45 am  Pertinent Negatives: Patient denies nausea, vomiting, diarrhea, chest pain, difficulty breathing, injuries, fevers, bleeding Disposition: [] ED /[] Urgent Care (no appt availability in office) / [] Appointment(In office/virtual)/ []  [facility]/ [] Home Care/ [x] Refused Recommended Disposition /[] [facility]/ []  Follow-up with PCP Additional Notes: Patient called and advised that Monday (2 days ago) she went to the ER for dizziness, had lab work, had scans to rule out stroke, and she was told she had an inner ear infection, and Dramamine was what she was prescribed. Patient actually got the Antivert prescription and was told by the pharmacy that Dramamine is the same as Antivert. Patient states that she physically got the Dramamine and not the Antivert. Patient states that today she woke up fine, went to work, had some coffee, took her dramamine, and now feels very dizzy. Patient states that at the ER her EKG was normal, urine was normal, scans were normal.  Patient states that now her vision is different.  She states that her left eye feels "heavy" and when getting her to check if her vision was different, she did say that her left eye now is "blurred".  She states that this is a new sign.  Patient is advised that at this time with their being dizziness and a change in her vision, it is recommended that she goes to the ER.  Patient states that she didn't want to go to the ER so it's unclear if she will go back to the ER for assessment.  Patient is encouraged to get checked out at the ER to be on the safe side and  possibly discuss the medications with a provider there and to assess her new symptoms with this left eye vision being "different".  Patient verbalized understanding but it's unclear if she is going to go at this time.     Reason for Disposition  Loss of vision or double vision  (Exception: Similar to previous migraines.)  Answer Assessment - Initial Assessment Questions 1. DESCRIPTION: "Describe your dizziness."     "More of a light headedness" "I feel very drunk" 2. LIGHTHEADED: "Do you feel lightheaded?" (e.g., somewhat faint, woozy, weak upon standing)     "Not stable" "left eye feels heavy" 3. VERTIGO: "Do you feel like either you or the room is spinning or tilting?" (i.e. vertigo)     no 4. SEVERITY: "How bad is it?"  "Do you feel like you are going to faint?" "Can you stand and walk?"   - MILD: Feels slightly dizzy, but walking normally.   - MODERATE: Feels unsteady when walking, but not falling; interferes with normal activities (e.g., school, work).   - SEVERE: Unable to walk without falling, or requires assistance to walk without falling; feels like passing out now.      "Woke up fine, went to work, coffee but that's normal, took dramamine and don't feel well.  Dizziness is getting worse again" 5. ONSET:  "When did the dizziness begin?"     Monday morning at 5:45 am  6. AGGRAVATING FACTORS: "Does anything make it worse?" (e.g., standing, change in head position)     no 7.  HEART RATE: "Can you tell me your heart rate?" "How many beats in 15 seconds?"  (Note: not all patients can do this)       "Now that I am scared but it isnt extremely slow or extremely fast" 8. CAUSE: "What do you think is causing the dizziness?"     Unknown ER said it was an inner ear infection 9. RECURRENT SYMPTOM: "Have you had dizziness before?" If Yes, ask: "When was the last time?" "What happened that time?"     Yes went to ER  not better 10. OTHER SYMPTOMS: "Do you have any other symptoms?" (e.g.,  fever, chest pain, vomiting, diarrhea, bleeding)       no  Protocols used: Dizziness - Lightheadedness-A-AH

## 2024-02-03 ENCOUNTER — Encounter: Payer: Self-pay | Admitting: Family Medicine

## 2024-02-03 ENCOUNTER — Ambulatory Visit: Payer: BC Managed Care – PPO | Admitting: Family Medicine

## 2024-02-03 VITALS — BP 124/76 | HR 70 | Temp 98.0°F | Wt 165.4 lb

## 2024-02-03 DIAGNOSIS — R42 Dizziness and giddiness: Secondary | ICD-10-CM | POA: Diagnosis not present

## 2024-02-03 LAB — BASIC METABOLIC PANEL
BUN: 23 mg/dL (ref 6–23)
CO2: 27 meq/L (ref 19–32)
Calcium: 9.2 mg/dL (ref 8.4–10.5)
Chloride: 106 meq/L (ref 96–112)
Creatinine, Ser: 0.96 mg/dL (ref 0.40–1.20)
GFR: 63.33 mL/min (ref >60.00)
Glucose, Bld: 94 mg/dL (ref 70–99)
Potassium: 4.9 meq/L (ref 3.5–5.1)
Sodium: 140 meq/L (ref 135–145)

## 2024-02-03 LAB — MAGNESIUM: Magnesium: 2 mg/dL (ref 1.5–2.5)

## 2024-02-03 MED ORDER — FLUTICASONE PROPIONATE 50 MCG/ACT NA SUSP: 2.0000 | Freq: Every day | NASAL | 0 refills | Status: DC

## 2024-02-03 NOTE — Patient Instructions (Addendum)
Suspect the virus you had last week caused your current symptoms. It is called Labyrinthitis.  Vertigo Vertigo is the feeling that you or the things around you are moving or spinning when they're not. It's different than feeling dizzy. It can also cause: Loss of balance. Trouble standing or walking. Nausea and vomiting. This feeling can come and go at any time. It can last from a few seconds to minutes or even hours. It may go away on its own or be treated with medicine Your health care provider will do tests to find out what kind of vertigo you have. This will help them decide on the right treatment for you. Follow these instructions at home: Eating and drinking Drink enough fluid to keep your pee (urine) pale yellow. Do not drink alcohol. Activity When you get up in the morning, first sit up on the side of the bed. When you feel okay, stand slowly while holding onto something. Move slowly. Avoid sudden body or head movements. Avoid certain positions, as told by your provider. Use a cane if you have trouble standing or walking. Sit down right away if you feel unsteady. Place items in your home so they're easy for you to reach without bending or leaning over. Return to normal activities when you're told. Ask what things are safe for you to do. General instructions Take your medicines only as told by your provider. Contact a health care provider if: Your medicines don't help or make your vertigo worse. You get new symptoms. You have a fever. You have nausea or vomiting. Your family or friends spot any changes in how you're acting. A part of your body goes numb. You feel tingling and prickling in a part of your body. You get very bad headaches. Get help right away if: You're always dizzy or you faint. You have a stiff neck. You have trouble moving or speaking. Your hands, arms, or legs feel weak. Your hearing or eyesight changes. These symptoms may be an emergency. Call 911 right  away. Do not wait to see if the symptoms will go away. Do not drive yourself to the hospital. This information is not intended to replace advice given to you by your health care provider. Make sure you discuss any questions you have with your health care provider. Document Revised: 09/08/2023 Document Reviewed: 03/11/2023 Elsevier Patient Education  2024 [employer].

## 2024-02-03 NOTE — Progress Notes (Signed)
 Connie Knox , 1961-11-30, 63 y.o., female MRN: [id number] Patient Care Team    Relationship Specialty Notifications Start End  Natalia Leatherwood, DO PCP - General Family Medicine  06/30/18   Davina Poke  Optometry  07/03/18   Harold Hedge, MD Consulting Physician Obstetrics and Gynecology  07/03/18   Sharrell Ku, MD Consulting Physician Gastroenterology  07/03/18     Chief Complaint  Patient presents with   Dizziness    Woke up Monday morning feeling severely dizzy. Since going to the ED the dizziness has subsided but still feeling light headed.     Subjective: Connie Knox is a 63 y.o. Pt presents for an OV with complaints of severe dizziness. Patient was seen in the emergency room 4 days ago for this condition.  She had reported starting a magnesium supplement for leg cramping, which did help with the leg cramping.  However the night before the onset of dizziness she took more of the magnesium supplement than usual.  She woke up at 5 AM feeling lightheaded and off-balance.  Denied vertigo-like symptoms nausea, vomiting, diarrhea or weakness/numbness. EKG normal sinus rhythm with an undetermined T wave abnormality which is consistent with 2021 EKG She had a very mild elevation in her creatinine to 1.05, the remaining of electrolytes were normal with the exception of a mild increase in glucose to 113, magnesium within normal range at 2.2.  CK was normal.  CBC within normal limits. Denies room spinning or lightheaded. Left eye blurry and feels droopy.     MRI brain 01/30/2024: FINDINGS: Brain: No restricted diffusion to suggest acute or subacute infarct. No acute hemorrhage, mass, mass effect, or midline shift. No hydrocephalus or extra-axial collection. Pituitary and craniocervical junction within normal limits.  No hemosiderin deposition to suggest remote hemorrhage. Normal cerebral volume. No disproportionate lobar atrophy. Scattered T2 hyperintense signal  in the periventricular white matter, likely the sequela of minimal chronic small vessel ischemic disease. Vascular: Normal arterial flow voids. Skull and upper cervical spine: Normal marrow signal. Sinuses/Orbits: Clear paranasal sinuses. No acute finding in the orbits. Other: The mastoid air cells are well aerated. IMPRESSION: No acute intracranial process. No evidence of acute or subacute infarct.       11/08/2023    1:53 PM 11/08/2023    1:52 PM 07/18/2023    1:34 PM 04/14/2022    8:34 AM 10/13/2020    8:11 AM  Depression screen PHQ 2/9  Decreased Interest 0 0 0 0 0  Down, Depressed, Hopeless 0 0 0 0 0  PHQ - 2 Score 0 0 0 0 0    Allergies  Allergen Reactions   Atorvastatin Other (See Comments)    Goes to her legs and she can't walk   Ciprofloxacin Other (See Comments)    Patient said shes not allergic   Aleve [Naproxen Sodium] Palpitations   Social History   Social History Narrative   Married.  2 children.   Bachelors degree.  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   Drinks alcohol socially.  Former smoker.   Exercises routinely.   Eats dairy products.   Drinks caffeine.   Had smoke alarm in the home.   Wears her seatbelt.   Feels safe in her relationships.   Past Medical History:  Diagnosis Date   Adenomatous colon polyp    Anxiety 10/04/2019   Arthritis    "knees" (05/12/2016)   BCC (basal  cell carcinoma of skin) 12/2018   BCC mid-chest and shin   Chicken pox    Depression    Dyslipidemia    GERD (gastroesophageal reflux disease)    Hearing loss in right ear    chronic   Panic attack    Pubic ramus fracture (HCC) 05/11/2016   fell down carpeted steps.  Pubic ramus fracture, sacral fracture.   Past Surgical History:  Procedure Laterality Date   DILATION AND CURETTAGE OF UTERUS  X 9   "all miscarriages"   Family History  Problem Relation Age of Onset   Lung cancer Maternal Grandmother    Stroke Maternal Grandfather     Ulcers Maternal Grandfather    Breast cancer Paternal Grandmother    Stomach cancer Paternal Grandfather    Allergies as of 02/03/2024       Reactions   Atorvastatin Other (See Comments)   Goes to her legs and she can't walk   Ciprofloxacin Other (See Comments)   Patient said shes not allergic   Aleve [naproxen Sodium] Palpitations        Medication List        Accurate as of February 03, 2024  9:09 AM. If you have any questions, ask your nurse or doctor.          diclofenac 75 MG EC tablet Commonly known as: VOLTAREN Take 75 mg by mouth at bedtime.   escitalopram 10 MG tablet Commonly known as: Lexapro Take 1 tablet (10 mg total) by mouth daily.   Fish Oil 1000 MG Caps Take 1,000 mg by mouth at bedtime.   fluticasone 50 MCG/ACT nasal spray Commonly known as: FLONASE Place 2 sprays into both nostrils daily. Started by: Felix Pacini   hydrOXYzine 10 MG tablet Commonly known as: ATARAX Take 1 tablet (10 mg total) by mouth 3 (three) times daily as needed.   MAGNESIUM PO Take 350 mg by mouth at bedtime. Drink 1/2 teaspoonful   meclizine 25 MG tablet Commonly known as: ANTIVERT Take 1 tablet (25 mg total) by mouth 3 (three) times daily as needed for dizziness.   pantoprazole 40 MG tablet Commonly known as: PROTONIX Take 1 tablet (40 mg total) by mouth daily.   pravastatin 40 MG tablet Commonly known as: PRAVACHOL Take 1 tablet (40 mg total) by mouth at bedtime.   Tart Cherry 500 MG Caps Take 2 capsules by mouth at bedtime as needed.   Vitamin B-12 5000 MCG Tbdp Take 4 drops by mouth every morning. Fill the dropper to 1/49mls and it makes 4 drops   VITAMIN D PO Take 50 mcg by mouth at bedtime.        All past medical history, surgical history, allergies, family history, immunizations andmedications were updated in the EMR today and reviewed under the history and medication portions of their EMR.     ROS Negative, with the exception of above  mentioned in HPI   Objective:  BP 124/76   Pulse 70   Temp 98 F (36.7 C)   Wt 165 lb 6.4 oz (75 kg)   SpO2 98%   BMI 26.70 kg/m  Body mass index is 26.7 kg/m. Physical Exam Vitals and nursing note reviewed.  Constitutional:      General: She is not in acute distress.    Appearance: Normal appearance. She is not ill-appearing, toxic-appearing or diaphoretic.  HENT:     Head: Normocephalic and atraumatic.     Right Ear: Tympanic membrane, ear canal and external ear  normal. There is no impacted cerumen.     Left Ear: Tympanic membrane, ear canal and external ear normal. There is no impacted cerumen.     Nose: No congestion or rhinorrhea.  Eyes:     General: No scleral icterus.       Right eye: No discharge.        Left eye: No discharge.     Extraocular Movements: Extraocular movements intact.     Conjunctiva/sclera: Conjunctivae normal.     Pupils: Pupils are equal, round, and reactive to light.  Cardiovascular:     Rate and Rhythm: Normal rate and regular rhythm.  Pulmonary:     Effort: Pulmonary effort is normal. No respiratory distress.     Breath sounds: Normal breath sounds. No wheezing, rhonchi or rales.  Musculoskeletal:     Cervical back: Neck supple.     Right lower leg: No edema.     Left lower leg: No edema.  Lymphadenopathy:     Cervical: No cervical adenopathy.  Skin:    General: Skin is warm.     Findings: No rash.  Neurological:     Mental Status: She is alert and oriented to person, place, and time. Mental status is at baseline.     Motor: No weakness.     Gait: Gait normal.  Psychiatric:        Mood and Affect: Mood normal.        Behavior: Behavior normal.        Thought Content: Thought content normal.        Judgment: Judgment normal.      No results found. No results found. No results found for this or any previous visit (from the past 24 hours).  Assessment/Plan: Connie Knox is a 63 y.o. female present for OV for  Dizziness  (Primary) Discussed vertigo vs dizziness today. Discussed mag supplements. Can use OTC mag pills. Would avoid the supplement she currently is using which contains sugar substitute, which can cause dizziness.  - start flonase daily for 2-4 weeks - Basic Metabolic Panel (BMET) - Magnesium If not seeing improvement within next 2-4 weeks, would consider vestibular rehab.    Reviewed expectations re: course of current medical issues. Discussed self-management of symptoms. Outlined signs and symptoms indicating need for more acute intervention. Patient verbalized understanding and all questions were answered. Patient received an After-Visit Summary.    Orders Placed This Encounter  Procedures   Basic Metabolic Panel (BMET)   Magnesium   Meds ordered this encounter  Medications   fluticasone (FLONASE) 50 MCG/ACT nasal spray    Sig: Place 2 sprays into both nostrils daily.    Dispense:  16 g    Refill:  0   Referral Orders  No referral(s) requested today     Note is dictated utilizing voice recognition software. Although note has been proof read prior to signing, occasional typographical errors still can be missed. If any questions arise, please do not hesitate to call for verification.   electronically signed by:  Felix Pacini, DO  [facility] [place]

## 2024-04-17 ENCOUNTER — Ambulatory Visit: Payer: BC Managed Care – PPO | Admitting: Family Medicine

## 2024-04-17 VITALS — BP 120/78 | HR 74 | Wt 155.4 lb

## 2024-04-17 DIAGNOSIS — E785 Hyperlipidemia, unspecified: Secondary | ICD-10-CM | POA: Diagnosis not present

## 2024-04-17 DIAGNOSIS — G720 Drug-induced myopathy: Secondary | ICD-10-CM | POA: Diagnosis not present

## 2024-04-17 DIAGNOSIS — F410 Panic disorder [episodic paroxysmal anxiety] without agoraphobia: Secondary | ICD-10-CM | POA: Diagnosis not present

## 2024-04-17 DIAGNOSIS — K219 Gastro-esophageal reflux disease without esophagitis: Secondary | ICD-10-CM

## 2024-04-17 DIAGNOSIS — T466X5A Adverse effect of antihyperlipidemic and antiarteriosclerotic drugs, initial encounter: Secondary | ICD-10-CM

## 2024-04-17 LAB — LIPID PANEL
Cholesterol: 191 mg/dL (ref 0–200)
HDL: 69.4 mg/dL (ref >39.00)
LDL Cholesterol: 104 mg/dL — ABNORMAL HIGH (ref 0–99)
NonHDL: 121.89
Total CHOL/HDL Ratio: 3
Triglycerides: 90 mg/dL (ref 0.0–149.0)
VLDL: 18 mg/dL (ref 0.0–40.0)

## 2024-04-17 LAB — HEPATIC FUNCTION PANEL
ALT: 26 U/L (ref 0–35)
AST: 36 U/L (ref 0–37)
Albumin: 4.4 g/dL (ref 3.5–5.2)
Alkaline Phosphatase: 59 U/L (ref 39–117)
Bilirubin, Direct: 0.2 mg/dL (ref 0.0–0.3)
Total Bilirubin: 0.6 mg/dL (ref 0.2–1.2)
Total Protein: 7 g/dL (ref 6.0–8.3)

## 2024-04-17 MED ORDER — PANTOPRAZOLE SODIUM 40 MG PO TBEC
40.0000 mg | DELAYED_RELEASE_TABLET | Freq: Every day | ORAL | 3 refills | Status: DC
Start: 2024-04-17 — End: 2024-08-07

## 2024-04-17 MED ORDER — PRAVASTATIN SODIUM 40 MG PO TABS: 40.0000 mg | ORAL_TABLET | Freq: Every day | ORAL | 3 refills | Status: DC

## 2024-04-17 NOTE — Patient Instructions (Addendum)
 Next appt is your physical and you have it scheduled.        Great to see you today.  I have refilled the medication(s) we provide.   If labs were collected or images ordered, we will inform you of  results once we have received them and reviewed. We will contact you either by echart message, or telephone call.  Please give ample time to the testing facility, and our office to run,  receive and review results. Please do not call inquiring of results, even if you can see them in your chart. We will contact you as soon as we are able. If it has been over 1 week since the test was completed, and you have not yet heard from us , then please call us .    - echart message- for normal results that have been seen by the patient already.   - telephone call: abnormal results or if patient has not viewed results in their echart.  If a referral to a specialist was entered for you, please call us  in 2 weeks if you have not heard from the specialist office to schedule.

## 2024-04-17 NOTE — Progress Notes (Signed)
 Patient ID: Connie Knox, female  DOB: 1961-10-01, 63 y.o.   MRN: [id number] Patient Care Team    Relationship Specialty Notifications Start End  Mariel Shope, DO PCP - General Family Medicine  06/30/18   Garfield Jungling  Optometry  07/03/18   Thora Flint, MD Consulting Physician Obstetrics and Gynecology  07/03/18   Serafin Dames, MD Consulting Physician Gastroenterology  07/03/18     Chief Complaint  Patient presents with   Medical Management of Chronic Issues    5 month follow up.    Subjective: Connie Knox is a 63 y.o.  Female  present for Chronic Conditions/illness Management  All past medical history, surgical history, allergies, family history, immunizations, medications and social history were updated in the electronic medical record today. All recent labs, ED visits and hospitalizations within the last year were reviewed. Anxiety:she is doing ok, rarely uses vistaril .  Prior note: Pt presents for an OV with complaints of increased anxiety with panic attacks of 2 weeks duration. She reports her anxiety started the day of the election. She was feeling palpitations, "loopy", headache and she has had intermittent chest discomfort.  She states the symptoms are worse at night, because she is thinking more. She endorses fatigue. She states she is still active and working without issues of chest pain or dyspnea.  She has noticed she is experiencing more reflux symptoms, despite taking her Protonix  daily. She has been prescribed diclofenac by ortho, she has been taking it for about 4-5 weeks.   GERD: Patient reports compliance with Protonix  40 mg daily.    Dyslipidemia/statin myopathy: Patient has had statin myopathy in the past to Lipitor.  She has been working on exercise and dietary changes.   She reports compliance with start of pravastatin  last visit. Tolerating.      04/17/2024    9:01 AM 11/08/2023    1:53 PM 11/08/2023    1:52 PM 07/18/2023    1:34 PM  04/14/2022    8:34 AM  Depression screen PHQ 2/9  Decreased Interest 0 0 0 0 0  Down, Depressed, Hopeless 0 0 0 0 0  PHQ - 2 Score 0 0 0 0 0  Altered sleeping 0      Tired, decreased energy 0      Change in appetite 0      Feeling bad or failure about yourself  0      Trouble concentrating 0      Moving slowly or fidgety/restless 0      Suicidal thoughts 0      PHQ-9 Score 0      Difficult doing work/chores Not difficult at all          04/17/2024    9:01 AM 11/08/2023    1:53 PM  GAD 7 : Generalized Anxiety Score  Nervous, Anxious, on Edge 0 3  Control/stop worrying 0 0  Worry too much - different things 0 0  Trouble relaxing 0 3  Restless 0 0  Easily annoyed or irritable 0 0  Afraid - awful might happen 0 3  Total GAD 7 Score 0 9  Anxiety Difficulty Not difficult at all Not difficult at all     Immunization History  Administered Date(s) Administered   PFIZER(Purple Top)SARS-COV-2 Vaccination 02/28/2020, 03/24/2020, 10/10/2020   Tdap 01/12/2021   Zoster Recombinant(Shingrix) 01/16/2021, 02/20/2021    Past Medical History:  Diagnosis Date   Adenomatous colon polyp    Anxiety 10/04/2019  Arthritis    "knees" (05/12/2016)   BCC (basal cell carcinoma of skin) 12/2018   BCC mid-chest and shin   Chicken pox    Depression    Dyslipidemia    GERD (gastroesophageal reflux disease)    Hearing loss in right ear    chronic   Panic attack    Pubic ramus fracture (HCC) 05/11/2016   fell down carpeted steps.  Pubic ramus fracture, sacral fracture.   Allergies  Allergen Reactions   Atorvastatin Other (See Comments)    Goes to her legs and she can't walk   Ciprofloxacin Other (See Comments)    Patient said shes not allergic   Aleve  [Naproxen  Sodium] Palpitations   Past Surgical History:  Procedure Laterality Date   DILATION AND CURETTAGE OF UTERUS  X 9   "all miscarriages"   Family History  Problem Relation Age of Onset   Lung cancer Maternal Grandmother     Stroke Maternal Grandfather    Ulcers Maternal Grandfather    Breast cancer Paternal Grandmother    Stomach cancer Paternal Grandfather    Social History   Social History Narrative   Married.  2 children.   Bachelors degree.  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   Drinks alcohol socially.  Former smoker.   Exercises routinely.   Eats dairy products.   Drinks caffeine.   Had smoke alarm in the home.   Wears her seatbelt.   Feels safe in her relationships.    Allergies as of 04/17/2024       Reactions   Atorvastatin Other (See Comments)   Goes to her legs and she can't walk   Ciprofloxacin Other (See Comments)   Patient said shes not allergic   Aleve  [naproxen  Sodium] Palpitations        Medication List        Accurate as of April 17, 2024  9:21 AM. If you have any questions, ask your nurse or doctor.          STOP taking these medications    MAGNESIUM PO       TAKE these medications    diclofenac 75 MG EC tablet Commonly known as: VOLTAREN Take 75 mg by mouth at bedtime.   Fish Oil 1000 MG Caps Take 1,000 mg by mouth at bedtime.   fluticasone  50 MCG/ACT nasal spray Commonly known as: FLONASE  Place 2 sprays into both nostrils daily.   hydrOXYzine  10 MG tablet Commonly known as: ATARAX  Take 1 tablet (10 mg total) by mouth 3 (three) times daily as needed.   meclizine  25 MG tablet Commonly known as: ANTIVERT  Take 1 tablet (25 mg total) by mouth 3 (three) times daily as needed for dizziness.   pantoprazole  40 MG tablet Commonly known as: PROTONIX  Take 1 tablet (40 mg total) by mouth daily.   pravastatin  40 MG tablet Commonly known as: PRAVACHOL  Take 1 tablet (40 mg total) by mouth at bedtime.   Tart Cherry 500 MG Caps Take 2 capsules by mouth at bedtime as needed.   Vitamin B-12 5000 MCG Tbdp Take 4 drops by mouth every morning. Fill the dropper to 1/57mls and it makes 4 drops   VITAMIN D  PO Take 50 mcg by mouth  at bedtime.        All past medical history, surgical history, allergies, family history, immunizations andmedications were updated in the EMR today and reviewed under the history and medication portions of their EMR.  ROS: 14 pt review of systems performed and negative (unless mentioned in an HPI)  Objective: BP 120/78   Pulse 74   Wt 155 lb 6.4 oz (70.5 kg)   SpO2 96%   BMI 25.08 kg/m  Physical Exam Vitals and nursing note reviewed.  Constitutional:      General: She is not in acute distress.    Appearance: Normal appearance. She is not ill-appearing, toxic-appearing or diaphoretic.  HENT:     Head: Normocephalic and atraumatic.  Eyes:     General: No scleral icterus.       Right eye: No discharge.        Left eye: No discharge.     Extraocular Movements: Extraocular movements intact.     Conjunctiva/sclera: Conjunctivae normal.     Pupils: Pupils are equal, round, and reactive to light.  Cardiovascular:     Rate and Rhythm: Normal rate and regular rhythm.  Pulmonary:     Effort: Pulmonary effort is normal. No respiratory distress.     Breath sounds: Normal breath sounds. No wheezing, rhonchi or rales.  Musculoskeletal:     Right lower leg: No edema.     Left lower leg: No edema.  Skin:    General: Skin is warm.     Findings: No rash.  Neurological:     Mental Status: She is alert and oriented to person, place, and time. Mental status is at baseline.     Motor: No weakness.     Gait: Gait normal.  Psychiatric:        Mood and Affect: Mood normal.        Behavior: Behavior normal.        Thought Content: Thought content normal.        Judgment: Judgment normal.     No results found.  Assessment/plan: Connie Knox is a 63 y.o. female present for Chronic Conditions/illness Management Dyslipidemia Currently taking fish oil supplementation and pravastatin  Encouraged heart healthy diet and routine exercise - cardiac CT discussed and ordered for her  today (self pay screen) -Lipid panel and lft collected   Gastroesophageal reflux disease without esophagitis Continue Protonix  40 mg daily Pepcid  nightly as needed Cautioned on use of Voltaren prescribed by orthopedics-can cause worsening reflux  Severe anxiety with panic Onset was election day. She was stressing and anxious about the outcome.  Continue vistaril  5 -10 mg prn for panic and sleep Has not needed in a long itme, but keeps with her    Has appt for cpe already scheduled.  No follow-ups on file.  Orders Placed This Encounter  Procedures   CT CARDIAC SCORING (SELF PAY ONLY)   Lipid panel   Hepatic function panel    Meds ordered this encounter  Medications   pravastatin  (PRAVACHOL ) 40 MG tablet    Sig: Take 1 tablet (40 mg total) by mouth at bedtime.    Dispense:  90 tablet    Refill:  3   pantoprazole  (PROTONIX ) 40 MG tablet    Sig: Take 1 tablet (40 mg total) by mouth daily.    Dispense:  90 tablet    Refill:  3   Referral Orders  No referral(s) requested today    Electronically signed by: Napolean Backbone, DO [facility] [place]

## 2024-04-18 ENCOUNTER — Encounter: Payer: Self-pay | Admitting: Family Medicine

## 2024-04-26 DIAGNOSIS — Z6825 Body mass index (BMI) 25.0-25.9, adult: Secondary | ICD-10-CM | POA: Diagnosis not present

## 2024-04-26 DIAGNOSIS — Z1231 Encounter for screening mammogram for malignant neoplasm of breast: Secondary | ICD-10-CM | POA: Diagnosis not present

## 2024-04-26 DIAGNOSIS — Z13820 Encounter for screening for osteoporosis: Secondary | ICD-10-CM | POA: Diagnosis not present

## 2024-04-26 DIAGNOSIS — Z01419 Encounter for gynecological examination (general) (routine) without abnormal findings: Secondary | ICD-10-CM | POA: Diagnosis not present

## 2024-04-26 LAB — HM MAMMOGRAPHY

## 2024-05-30 ENCOUNTER — Ambulatory Visit (HOSPITAL_BASED_OUTPATIENT_CLINIC_OR_DEPARTMENT_OTHER)
Admission: RE | Admit: 2024-05-30 | Discharge: 2024-05-30 | Disposition: A | Discharge status: Home / Self Care | Payer: Self-pay | Source: Ambulatory Visit | Attending: Family Medicine | Admitting: Family Medicine

## 2024-05-30 DIAGNOSIS — I700 Atherosclerosis of aorta: Secondary | ICD-10-CM | POA: Diagnosis not present

## 2024-05-30 DIAGNOSIS — M5416 Radiculopathy, lumbar region: Secondary | ICD-10-CM | POA: Diagnosis not present

## 2024-05-30 DIAGNOSIS — M5459 Other low back pain: Secondary | ICD-10-CM | POA: Diagnosis not present

## 2024-05-30 DIAGNOSIS — E785 Hyperlipidemia, unspecified: Secondary | ICD-10-CM | POA: Insufficient documentation

## 2024-05-31 ENCOUNTER — Ambulatory Visit: Payer: Self-pay | Admitting: Family Medicine

## 2024-05-31 DIAGNOSIS — I2510 Atherosclerotic heart disease of native coronary artery without angina pectoris: Secondary | ICD-10-CM | POA: Insufficient documentation

## 2024-05-31 DIAGNOSIS — G720 Drug-induced myopathy: Secondary | ICD-10-CM

## 2024-05-31 DIAGNOSIS — R931 Abnormal findings on diagnostic imaging of heart and coronary circulation: Secondary | ICD-10-CM | POA: Insufficient documentation

## 2024-05-31 DIAGNOSIS — E785 Hyperlipidemia, unspecified: Secondary | ICD-10-CM

## 2024-05-31 HISTORY — DX: Abnormal findings on diagnostic imaging of heart and coronary circulation: R93.1

## 2024-05-31 NOTE — Telephone Encounter (Signed)
 Please call patient Her coronary calcium score is elevated and is a high risk result. She has elevated coronary calcium in 2 of the 4 coronary arteries. Heart score is 372, and this places her in the 95th percentile for age/gender/race  For her score above the 75th percentile, cardiology referral is recommended for further discussion.    -I have placed this referral for her, and they should reach out to her to get her scheduled as soon as possible.   -For now continue pravastatin , we may need to increase this dose or change to a different medication-cardiology will discuss further.   -Heart healthy diet and routine exercise recommended.

## 2024-06-08 DIAGNOSIS — M8580 Other specified disorders of bone density and structure, unspecified site: Secondary | ICD-10-CM | POA: Diagnosis not present

## 2024-06-13 ENCOUNTER — Ambulatory Visit: Payer: Self-pay

## 2024-06-13 NOTE — Chronic Care Management (AMB) (Signed)
 Encounter for care management documentation

## 2024-06-25 DIAGNOSIS — M5416 Radiculopathy, lumbar region: Secondary | ICD-10-CM | POA: Diagnosis not present

## 2024-06-26 ENCOUNTER — Ambulatory Visit

## 2024-06-26 VITALS — BP 118/76 | HR 67 | Ht 66.0 in | Wt 154.0 lb

## 2024-06-26 DIAGNOSIS — R931 Abnormal findings on diagnostic imaging of heart and coronary circulation: Secondary | ICD-10-CM | POA: Diagnosis not present

## 2024-06-26 DIAGNOSIS — E785 Hyperlipidemia, unspecified: Secondary | ICD-10-CM | POA: Diagnosis not present

## 2024-06-26 DIAGNOSIS — R072 Precordial pain: Secondary | ICD-10-CM | POA: Diagnosis not present

## 2024-06-26 MED ORDER — CLOPIDOGREL BISULFATE 75 MG PO TABS: 75.0000 mg | ORAL_TABLET | Freq: Every day | ORAL | 3 refills | Status: AC

## 2024-06-26 MED ORDER — METOPROLOL TARTRATE 100 MG PO TABS: 100.0000 mg | ORAL_TABLET | Freq: Once | ORAL | 0 refills | Status: DC

## 2024-06-26 MED ORDER — PRAVASTATIN SODIUM 80 MG PO TABS: 80.0000 mg | ORAL_TABLET | Freq: Every evening | ORAL | 3 refills | Status: DC

## 2024-06-26 NOTE — Patient Instructions (Signed)
 Medication Instructions:  Your physician has recommended you make the following change in your medication:   START: Pravastatin  80 mg every evening START: Plavix  75 mg daily  *If you need a refill on your cardiac medications before your next appointment, please call your pharmacy*  Lab Work: Your physician recommends that you return for lab work in:   Labs today: BMP  If you have labs (blood work) drawn today and your tests are completely normal, you will receive your results only by: MyChart Message (if you have MyChart) OR A paper copy in the mail If you have any lab test that is abnormal or we need to change your treatment, we will call you to review the results.  Testing/Procedures:   Your cardiac CT will be scheduled at one of the below locations:   [facility] [address] [place], [place] [address] [phone]  OR  [facility] [address] [place], [place] [address] [phone]  OR   [facility] [address] [place], [place] [address] [phone]  OR   [facility] [address] [place], [place] [address] [phone]  OR   [facility] [address]  [place], [place] [address]  If scheduled at [facility], please arrive at the [facility] Entrance (Entrance C2) of [facility] 30 minutes prior to test start time. You can use the FREE valet parking offered at entrance C (encouraged to control the heart rate for the test)  Proceed to the [facility] Radiology Department (first floor) to check-in and test prep.   All radiology patients and guests should use entrance C2 at [facility], accessed from [facility], even though the hospital's physical address listed is [address].    If scheduled at the Heart and Vascular Tower at [address], please enter the  parking lot using the [address] entrance and use the FREE valet service at the patient drop-off area. Enter the buidling and check-in with registration on the main floor.  If scheduled at [facility] or [facility], please arrive 15 mins early for check-in and test prep.  There is spacious parking and easy access to the radiology department from the [facility] entrance. Please enter here and check-in with the desk attendant.   If scheduled at [facility], please arrive 30 minutes early for check-in and test prep.  Please follow these instructions carefully (unless otherwise directed):  An IV will be required for this test and Nitroglycerin will be given.  Hold all erectile dysfunction medications at least 3 days (72 hrs) prior to test. (Ie viagra, cialis, sildenafil, tadalafil, etc)   On the Night Before the Test: Be sure to Drink plenty of water. Do not consume any caffeinated/decaffeinated beverages or chocolate 12 hours prior to your test. Do not take any antihistamines 12 hours prior to your test.  On the Day of the Test: Drink plenty of water until 1 hour prior to the test. Do not eat any food 1 hour prior to test. You may take your regular medications prior to the test.  Take metoprolol  (Lopressor ) two hours prior to test. Patients who wear a continuous glucose monitor MUST remove the device prior to scanning. FEMALES- please wear underwire-free bra if available, avoid dresses & tight clothing       After the Test:  Drink plenty of water. After receiving IV contrast, you may experience a mild flushed feeling. This is normal. On occasion, you may experience a mild rash up to 24 hours after the test. This is not dangerous. If this occurs, you can take Benadryl 25 mg, Zyrtec , Claritin, or Allegra and increase your fluid intake. (Patients taking Tikosyn should avoid Benadryl, and may take Zyrtec , Claritin, or  Allegra) If you experience trouble breathing, this can be serious. If it is severe call 911 IMMEDIATELY. If it is mild, please call our office.  We will call to schedule your test 2-4 weeks out understanding that some insurance companies will need an authorization prior to the service being performed.   For more information and frequently asked questions, please visit our website : [email or web address]  For non-scheduling related questions, please contact the cardiac imaging nurse navigator should you have any questions/concerns: Cardiac Imaging Nurse Navigators Direct Office Dial: [phone]   For scheduling needs, including cancellations and rescheduling, please call [place], [phone].   Follow-Up: At [facility], you and your health needs are our priority.  As part of our continuing mission to provide you with exceptional heart care, our providers are all part of one team.  This team includes your primary Cardiologist (physician) and Advanced Practice Providers or APPs (Physician Assistants and Nurse Practitioners) who all work together to provide you with the care you need, when you need it.  Your next appointment:   3 month(s)  Provider:   Alean Kobus, MD    We recommend signing up for the patient portal called MyChart.  Sign up information is provided on this After Visit Summary.  MyChart is used to connect with patients for Virtual Visits (Telemedicine).  Patients are able to view lab/test results, encounter notes, upcoming appointments, etc.  Non-urgent messages can be sent to your provider as well.   To learn more about what you can do with MyChart, go to [email or web address].   Other Instructions None

## 2024-06-26 NOTE — Progress Notes (Signed)
 Cardiology Consultation:    Date:  06/26/2024   ID:  Connie Knox, DOB Nov 26, 1961, MRN [id number]  PCP:  Catherine Charlies LABOR, DO  Cardiologist:  Alean JONELLE Kobus, MD   Referring MD: Catherine Charlies LABOR, DO   No chief complaint on file.    ASSESSMENT AND PLAN:   Connie Knox 63 year old woman with history of hyperlipidemia and GERD and now recently after calcium score done June 2025 noted elevated score 372.  Remote history of smoking for about 5 years back when she was in college.  2 drinks of alcohol every day.  No other prior cardiac testing such as stress test or echocardiogram.  Has atypical symptoms functional status limited due to ankle pain and back pain since October.  Nonspecific EKG changes with T wave inversions in inferior leads. With significant coronary plaque burden and elevated risk factors in the form of stress and hyperlipidemia, discuss further evaluation.   Problem List Items Addressed This Visit     Dyslipidemia (Chronic)   Dyslipidemia, improved lipid panel in April in comparison to October while on pravastatin .  Increase the intensity of pravastatin  to 80 mg once daily as discussed above. Return to      Relevant Medications   pravastatin  (PRAVACHOL ) 80 MG tablet   Elevated coronary artery calcium score; 372 and 95th% - Primary   Coronary atherosclerosis significant burden 95th percentile for her age.  In this context further evaluation with functional testing on an anatomical testing discussed. Functional testing is somewhat limited due to her ongoing back pain and ankle pain and baseline EKG changes showing T wave inversions in inferior leads.  In this context recommended to proceed with cardiac CT coronary angiogram, discussed use of contrast and agreeable to proceed.  Also given the significant plaque buildup recommended to start antiplatelet agent since her potential allergy to NSAIDs and aspirin , prescribed Plavix  75 mg once daily, discussed  potential side effect such as bleeding bruising related to blood thinning effect of Plavix .  Also uptitrate lipid-lowering therapy intensity by increasing pravastatin  dose to 80 mg once daily.  She preferred this rather than switching to a different statin given her prior side effects with atorvastatin.         Relevant Medications   pravastatin  (PRAVACHOL ) 80 MG tablet   Other Relevant Orders   EKG 12-Lead (Completed)   CT CORONARY MORPH W/CTA COR W/SCORE W/CA W/CM &/OR WO/CM   Basic Metabolic Panel (BMET)   Return to clinic tentatively in 3 months.   History of Present Illness:    Connie Knox is a 63 y.o. female who is being seen today for follow-up visit PCP is Kuneff, Renee A, DO.  Pleasant woman here for the visit by herself.  Has history of hyperlipidemia and GERD and now recently after calcium score done June 2025 noted elevated score 372.  Remote history of smoking for about 5 years back when she was in college.  2 drinks of alcohol every day.  No other prior cardiac testing such as stress test or echocardiogram.  Here for further discussion. Until October last year she was active doing kickboxing training regularly 3 times a week.  Since October she has had flareup of her back pain and ankle pain limiting her mobility and her exercise has reduced.  She continues to play golf walks moderate amounts.  No significant limitations with activities at home.  Does report significant anxiety and stress about the results and mentions. Denies any major day-to-day symptoms  but has had various visits.  Get evaluated for chest pain which she attributes to panic attacks.  More recently she has not been able to walk and exercise much due to ankle pain. Does have GERD symptoms which are well-controlled on current regimen with pantoprazole .  Reported allergy to Aleve  with chest pain.  Unclear if she has had any allergies to aspirin . Did not tolerate atorvastatin but has been doing  well with pravastatin  for the past 8 months.  In February with an episode of dizziness she presented to the ER 01/30/2024 and with MRI of the brain done ruled out acute stroke and showed no other significant findings.  Recently calcium score done 05/30/2024 was elevated 372 and there were no other significant extracardiac findings.   EKG in the clinic today shows sinus rhythm heart rate 67/min, PR interval 144 ms, T wave inversions in lead III and aVF.  In comparison to prior EKG from 01/30/2024 no significant change  Normal liver function panel 04/17/2024. Lipid panel from 04/17/2024 notes total cholesterol 191, LDL 104, HDL 69, triglycerides 90 Thyroid  panel from 11/09/2023 was normal Hemoglobin A1c from 11/09/2023 was normal 5.3 Past Medical History:  Diagnosis Date   Adenomatous colon polyp    Anxiety 10/04/2019   Arthritis    knees (05/12/2016)   BCC (basal cell carcinoma of skin) 12/2018   BCC mid-chest and shin   Chicken pox    Depression    Dyslipidemia    GERD (gastroesophageal reflux disease)    Hearing loss in right ear    chronic   Panic attack    Pubic ramus fracture (HCC) 05/11/2016   fell down carpeted steps.  Pubic ramus fracture, sacral fracture.    Past Surgical History:  Procedure Laterality Date   DILATION AND CURETTAGE OF UTERUS  X 9   all miscarriages    Current Medications: Current Meds  Medication Sig   clopidogrel  (PLAVIX ) 75 MG tablet Take 1 tablet (75 mg total) by mouth daily.   Cyanocobalamin  (VITAMIN B-12) 5000 MCG TBDP Take 4 drops by mouth every morning. Fill the dropper to 1/31mls and it makes 4 drops   diclofenac (VOLTAREN) 75 MG EC tablet Take 75 mg by mouth at bedtime.   fluticasone  (FLONASE ) 50 MCG/ACT nasal spray Place 2 sprays into both nostrils daily.   hydrOXYzine  (ATARAX ) 10 MG tablet Take 1 tablet (10 mg total) by mouth 3 (three) times daily as needed.   meclizine  (ANTIVERT ) 25 MG tablet Take 1 tablet (25 mg total) by mouth 3  (three) times daily as needed for dizziness.   Omega-3 Fatty Acids (FISH OIL) 1000 MG CAPS Take 1,000 mg by mouth at bedtime.   pantoprazole  (PROTONIX ) 40 MG tablet Take 1 tablet (40 mg total) by mouth daily.   pravastatin  (PRAVACHOL ) 80 MG tablet Take 1 tablet (80 mg total) by mouth every evening.   Tart Cherry 500 MG CAPS Take 2 capsules by mouth at bedtime as needed.   VITAMIN D  PO Take 50 mcg by mouth at bedtime.   [DISCONTINUED] pravastatin  (PRAVACHOL ) 40 MG tablet Take 1 tablet (40 mg total) by mouth at bedtime.     Allergies:   Pseudoephedrine-naproxen  na er, Atorvastatin, Ciprofloxacin, and Aleve  [naproxen  sodium]   Social History   Socioeconomic History   Marital status: Married    Spouse name: Not on file   Number of children: Not on file   Years of education: Not on file   Highest education level: Bachelor's degree (e.g., BA,  AB, BS)  Occupational History   Not on file  Tobacco Use   Smoking status: Former   Smokeless tobacco: Never   Tobacco comments:    smoked cigarettes as a teen  Vaping Use   Vaping status: Never Used  Substance and Sexual Activity   Alcohol use: Yes    Alcohol/week: 14.0 standard drinks of alcohol    Types: 14 Shots of liquor per week    Comment: 05/12/2016 2 shots vodka/day   Drug use: No   Sexual activity: Yes    Partners: Male  Other Topics Concern   Not on file  Social History Narrative   Married.  2 children.   Bachelors degree.  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   Drinks alcohol socially.  Former smoker.   Exercises routinely.   Eats dairy products.   Drinks caffeine.   Had smoke alarm in the home.   Wears her seatbelt.   Feels safe in her relationships.   Social Drivers of Corporate investment banker Strain: Low Risk  (04/13/2024)   Overall Financial Resource Strain (CARDIA)    Difficulty of Paying Living Expenses: Not hard at all  Food Insecurity: No Food Insecurity (04/13/2024)   Hunger  Vital Sign    Worried About Running Out of Food in the Last Year: Never true    Ran Out of Food in the Last Year: Never true  Transportation Needs: No Transportation Needs (04/13/2024)   PRAPARE - Administrator, Civil Service (Medical): No    Lack of Transportation (Non-Medical): No  Physical Activity: Unknown (04/13/2024)   Exercise Vital Sign    Days of Exercise per Week: 0 days    Minutes of Exercise per Session: Not on file  Stress: No Stress Concern Present (04/13/2024)   [employer] of Occupational Health - Occupational Stress Questionnaire    Feeling of Stress : Only a little  Social Connections: Socially Isolated (04/13/2024)   Social Connection and Isolation Panel    Frequency of Communication with Friends and Family: Once a week    Frequency of Social Gatherings with Friends and Family: Once a week    Attends Religious Services: Never    Database administrator or Organizations: No    Attends Engineer, structural: Not on file    Marital Status: Married     Family History: The patient's family history includes Breast cancer in her paternal grandmother; Lung cancer in her maternal grandmother; Stomach cancer in her paternal grandfather; Stroke in her maternal grandfather; Ulcers in her maternal grandfather. ROS:   Please see the history of present illness.    All 14 point review of systems negative except as described per history of present illness.  EKGs/Labs/Other Studies Reviewed:    The following studies were reviewed today:   EKG:  EKG Interpretation Date/Time:  Tuesday June 26 2024 08:03:05 EDT Ventricular Rate:  67 PR Interval:  144 QRS Duration:  80 QT Interval:  408 QTC Calculation: 431 R Axis:   76  Text Interpretation: Normal sinus rhythm ST & T wave abnormality, consider inferior ischemia When compared with ECG of 30-Jan-2024 09:40, No significant change was found Confirmed by Liborio Hai reddy [phone]) on 06/26/2024 8:14:40  AM    Recent Labs: 11/09/2023: TSH 2.19 01/30/2024: Hemoglobin 13.8; Platelets 282 02/03/2024: BUN 23; Creatinine, Ser 0.96; Magnesium 2.0; Potassium 4.9; Sodium 140 04/17/2024: ALT 26  Recent Lipid Panel  Component Value Date/Time   CHOL 191 04/17/2024 0859   TRIG 90.0 04/17/2024 0859   HDL 69.40 04/17/2024 0859   CHOLHDL 3 04/17/2024 0859   VLDL 18.0 04/17/2024 0859   LDLCALC 104 (H) 04/17/2024 0859   LDLCALC 158 (H) 01/12/2021 1404    Physical Exam:    VS:  BP 118/76   Pulse 67   Ht 5' 6 (1.676 m)   Wt 154 lb (69.9 kg)   SpO2 97%   BMI 24.86 kg/m     Wt Readings from Last 3 Encounters:  06/26/24 154 lb (69.9 kg)  04/17/24 155 lb 6.4 oz (70.5 kg)  02/03/24 165 lb 6.4 oz (75 kg)     GENERAL:  Well nourished, well developed in no acute distress NECK: No JVD; No carotid bruits CARDIAC: RRR, S1 and S2 present, no murmurs, no rubs, no gallops CHEST:  Clear to auscultation without rales, wheezing or rhonchi  Extremities: No pitting pedal edema. Pulses bilaterally symmetric with radial 2+ and dorsalis pedis 2+ NEUROLOGIC:  Alert and oriented x 3  Medication Adjustments/Labs and Tests Ordered: Current medicines are reviewed at length with the patient today.  Concerns regarding medicines are outlined above.  Orders Placed This Encounter  Procedures   CT CORONARY MORPH W/CTA COR W/SCORE W/CA W/CM &/OR WO/CM   Basic Metabolic Panel (BMET)   EKG 12-Lead   Meds ordered this encounter  Medications   pravastatin  (PRAVACHOL ) 80 MG tablet    Sig: Take 1 tablet (80 mg total) by mouth every evening.    Dispense:  90 tablet    Refill:  3   clopidogrel  (PLAVIX ) 75 MG tablet    Sig: Take 1 tablet (75 mg total) by mouth daily.    Dispense:  90 tablet    Refill:  3    Signed, Makiyah Zentz reddy Jewelia Bocchino, MD, MPH, [facility]. 06/26/2024 8:41 AM    [facility]

## 2024-06-26 NOTE — Assessment & Plan Note (Signed)
 Coronary atherosclerosis significant burden 95th percentile for her age.  In this context further evaluation with functional testing on an anatomical testing discussed. Functional testing is somewhat limited due to her ongoing back pain and ankle pain and baseline EKG changes showing T wave inversions in inferior leads.  In this context recommended to proceed with cardiac CT coronary angiogram, discussed use of contrast and agreeable to proceed.  Also given the significant plaque buildup recommended to start antiplatelet agent since her potential allergy to NSAIDs and aspirin , prescribed Plavix  75 mg once daily, discussed potential side effect such as bleeding bruising related to blood thinning effect of Plavix .  Also uptitrate lipid-lowering therapy intensity by increasing pravastatin  dose to 80 mg once daily.  She preferred this rather than switching to a different statin given her prior side effects with atorvastatin.

## 2024-06-26 NOTE — Assessment & Plan Note (Signed)
 Dyslipidemia, improved lipid panel in April in comparison to October while on pravastatin .  Increase the intensity of pravastatin  to 80 mg once daily as discussed above. Return to

## 2024-06-27 ENCOUNTER — Ambulatory Visit: Payer: Self-pay

## 2024-06-27 LAB — BASIC METABOLIC PANEL WITH GFR
BUN/Creatinine Ratio: 15 (ref 12–28)
BUN: 13 mg/dL (ref 8–27)
CO2: 22 mmol/L (ref 20–29)
Calcium: 9.8 mg/dL (ref 8.7–10.3)
Chloride: 102 mmol/L (ref 96–106)
Creatinine, Ser: 0.87 mg/dL (ref 0.57–1.00)
Glucose: 87 mg/dL (ref 70–99)
Potassium: 5.4 mmol/L — ABNORMAL HIGH (ref 3.5–5.2)
Sodium: 143 mmol/L (ref 134–144)
eGFR: 75 mL/min/1.73 (ref >59)

## 2024-07-02 ENCOUNTER — Telehealth (HOSPITAL_COMMUNITY): Payer: Self-pay | Admitting: *Deleted

## 2024-07-02 DIAGNOSIS — I700 Atherosclerosis of aorta: Secondary | ICD-10-CM | POA: Diagnosis not present

## 2024-07-02 DIAGNOSIS — M5416 Radiculopathy, lumbar region: Secondary | ICD-10-CM | POA: Diagnosis not present

## 2024-07-02 NOTE — Telephone Encounter (Signed)

## 2024-07-03 ENCOUNTER — Ambulatory Visit (HOSPITAL_BASED_OUTPATIENT_CLINIC_OR_DEPARTMENT_OTHER)
Admission: RE | Admit: 2024-07-03 | Discharge: 2024-07-03 | Disposition: A | Discharge status: Home / Self Care | Source: Ambulatory Visit

## 2024-07-03 DIAGNOSIS — R931 Abnormal findings on diagnostic imaging of heart and coronary circulation: Secondary | ICD-10-CM | POA: Diagnosis not present

## 2024-07-03 MED ORDER — IOHEXOL 350 MG/ML SOLN
100.0000 mL | Freq: Once | INTRAVENOUS | Status: AC | PRN
  Administered 2024-07-03: 95 mL via INTRAVENOUS

## 2024-07-03 MED ORDER — NITROGLYCERIN 0.4 MG SL SUBL
0.8000 mg | SUBLINGUAL_TABLET | Freq: Once | SUBLINGUAL | Status: AC
  Administered 2024-07-03: 0.8 mg via SUBLINGUAL

## 2024-07-04 ENCOUNTER — Other Ambulatory Visit: Payer: Self-pay | Admitting: Cardiology

## 2024-07-04 ENCOUNTER — Ambulatory Visit (HOSPITAL_BASED_OUTPATIENT_CLINIC_OR_DEPARTMENT_OTHER)
Admission: RE | Admit: 2024-07-04 | Discharge: 2024-07-04 | Disposition: A | Discharge status: Home / Self Care | Source: Ambulatory Visit | Attending: Cardiology | Admitting: Cardiology

## 2024-07-04 DIAGNOSIS — R931 Abnormal findings on diagnostic imaging of heart and coronary circulation: Secondary | ICD-10-CM | POA: Diagnosis not present

## 2024-07-04 NOTE — Progress Notes (Signed)
 FFR Order

## 2024-07-06 ENCOUNTER — Ambulatory Visit: Payer: Self-pay | Admitting: Cardiology

## 2024-07-11 DIAGNOSIS — M5416 Radiculopathy, lumbar region: Secondary | ICD-10-CM | POA: Diagnosis not present

## 2024-07-23 ENCOUNTER — Telehealth: Payer: Self-pay

## 2024-07-23 NOTE — Telephone Encounter (Signed)
 Communication  Reason for CRM: Patient was taking pravastatin  40mg  then went to 80 and her leg started hurting so  bad and couldn't walk at all and she stop taking them for about 5 days and she can walk, she would like to know what should she do because not taking them helps leg but she doesn't;t want to mess up her heart please call to further discuss   Pt scheduled.

## 2024-07-24 ENCOUNTER — Ambulatory Visit: Admitting: Family Medicine

## 2024-07-24 VITALS — BP 120/70 | HR 64 | Temp 97.7°F | Wt 155.4 lb

## 2024-07-24 DIAGNOSIS — M25551 Pain in right hip: Secondary | ICD-10-CM | POA: Diagnosis not present

## 2024-07-24 DIAGNOSIS — T466X5A Adverse effect of antihyperlipidemic and antiarteriosclerotic drugs, initial encounter: Secondary | ICD-10-CM

## 2024-07-24 DIAGNOSIS — G720 Drug-induced myopathy: Secondary | ICD-10-CM

## 2024-07-24 DIAGNOSIS — E785 Hyperlipidemia, unspecified: Secondary | ICD-10-CM

## 2024-07-24 DIAGNOSIS — R931 Abnormal findings on diagnostic imaging of heart and coronary circulation: Secondary | ICD-10-CM | POA: Diagnosis not present

## 2024-07-24 DIAGNOSIS — M791 Myalgia, unspecified site: Secondary | ICD-10-CM | POA: Diagnosis not present

## 2024-07-24 LAB — COMPREHENSIVE METABOLIC PANEL WITH GFR
ALT: 20 U/L (ref 0–35)
AST: 20 U/L (ref 0–37)
Albumin: 4.8 g/dL (ref 3.5–5.2)
Alkaline Phosphatase: 72 U/L (ref 39–117)
BUN: 15 mg/dL (ref 6–23)
CO2: 33 meq/L — ABNORMAL HIGH (ref 19–32)
Calcium: 10.1 mg/dL (ref 8.4–10.5)
Chloride: 99 meq/L (ref 96–112)
Creatinine, Ser: 0.95 mg/dL (ref 0.40–1.20)
GFR: 63.92 mL/min (ref >60.00)
Glucose, Bld: 120 mg/dL — ABNORMAL HIGH (ref 70–99)
Potassium: 4.7 meq/L (ref 3.5–5.1)
Sodium: 140 meq/L (ref 135–145)
Total Bilirubin: 0.4 mg/dL (ref 0.2–1.2)
Total Protein: 7.9 g/dL (ref 6.0–8.3)

## 2024-07-24 LAB — CK: Total CK: 97 U/L (ref 17–177)

## 2024-07-24 NOTE — Progress Notes (Signed)
 Connie Knox , 1961/01/14, 63 y.o., female MRN: [id number] Patient Care Team    Relationship Specialty Notifications Start End  Catherine Charlies LABOR, DO PCP - General Family Medicine  06/30/18   Abigail Maude POUR  Optometry  07/03/18   Curlene Agent, MD Consulting Physician Obstetrics and Gynecology  07/03/18   Luis Purchase, MD Consulting Physician Gastroenterology  07/03/18     Chief Complaint  Patient presents with   Medication Reaction    Pt stopped taking Pravastatin  last Tuesday due to her not being able to walk.     Subjective: Connie Knox is a 63 y.o. Pt presents for an OV with complaints of medication rx to pravastatin .  Associated symptoms include muscle pain causing her to not be able to walk. She stopped the increased dose of pravastatin  1 week ago.Each day since her pain is resolving Pravastatin  prescribed by Dr. Liborio at increased dose of 80 mg about 4 weeks ago d/t higher risk cardiac CT result.       04/17/2024    9:01 AM 11/08/2023    1:53 PM 11/08/2023    1:52 PM 07/18/2023    1:34 PM 04/14/2022    8:34 AM  Depression screen PHQ 2/9  Decreased Interest 0 0 0 0 0  Down, Depressed, Hopeless 0 0 0 0 0  PHQ - 2 Score 0 0 0 0 0  Altered sleeping 0      Tired, decreased energy 0      Change in appetite 0      Feeling bad or failure about yourself  0      Trouble concentrating 0      Moving slowly or fidgety/restless 0      Suicidal thoughts 0      PHQ-9 Score 0      Difficult doing work/chores Not difficult at all        Allergies  Allergen Reactions   Pseudoephedrine-Naproxen  Na Er     Other Reaction(s): cardiac arrest   Atorvastatin Other (See Comments)    Goes to her legs and she can't walk   Ciprofloxacin Other (See Comments)    Patient said shes not allergic   Aleve  [Naproxen  Sodium] Palpitations   Social History   Social History Narrative   Married.  2 children.   Bachelors degree.  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   Drinks alcohol socially.  Former smoker.   Exercises routinely.   Eats dairy products.   Drinks caffeine.   Had smoke alarm in the home.   Wears her seatbelt.   Feels safe in her relationships.   Past Medical History:  Diagnosis Date   Adenomatous colon polyp    Anxiety 10/04/2019   Arthritis    knees (05/12/2016)   BCC (basal cell carcinoma of skin) 12/2018   BCC mid-chest and shin   Chicken pox    Depression    Dyslipidemia    GERD (gastroesophageal reflux disease)    Hearing loss in right ear    chronic   Panic attack    Pubic ramus fracture (HCC) 05/11/2016   fell down carpeted steps.  Pubic ramus fracture, sacral fracture.   Past Surgical History:  Procedure Laterality Date   DILATION AND CURETTAGE OF UTERUS  X 9   all miscarriages   Family History  Problem Relation Age of Onset   Lung cancer Maternal Grandmother    Stroke Maternal  Grandfather    Ulcers Maternal Grandfather    Breast cancer Paternal Grandmother    Stomach cancer Paternal Grandfather    Allergies as of 07/24/2024       Reactions   Pseudoephedrine-naproxen  Na Er    Other Reaction(s): cardiac arrest   Atorvastatin Other (See Comments)   Goes to her legs and she can't walk   Ciprofloxacin Other (See Comments)   Patient said shes not allergic   Aleve  [naproxen  Sodium] Palpitations        Medication List        Accurate as of July 24, 2024  2:10 PM. If you have any questions, ask your nurse or doctor.          clopidogrel  75 MG tablet Commonly known as: PLAVIX  Take 1 tablet (75 mg total) by mouth daily.   diclofenac 75 MG EC tablet Commonly known as: VOLTAREN Take 75 mg by mouth at bedtime.   Fish Oil 1000 MG Caps Take 1,000 mg by mouth at bedtime.   fluticasone  50 MCG/ACT nasal spray Commonly known as: FLONASE  Place 2 sprays into both nostrils daily.   hydrOXYzine  10 MG tablet Commonly known as: ATARAX  Take 1 tablet (10 mg total) by mouth 3  (three) times daily as needed.   magnesium 84 MG ( ) Tbcr SR tablet Commonly known as: MAGTAB Take 84 mg by mouth.   meclizine  25 MG tablet Commonly known as: ANTIVERT  Take 1 tablet (25 mg total) by mouth 3 (three) times daily as needed for dizziness.   metoprolol  tartrate 100 MG tablet Commonly known as: LOPRESSOR  Take 1 tablet (100 mg total) by mouth once for 1 dose. Please take this medication 2 hours before CT   pantoprazole  40 MG tablet Commonly known as: PROTONIX  Take 1 tablet (40 mg total) by mouth daily.   pravastatin  80 MG tablet Commonly known as: PRAVACHOL  Take 1 tablet (80 mg total) by mouth every evening.   Tart Cherry 500 MG Caps Take 2 capsules by mouth at bedtime as needed.   Vitamin B-12 5000 MCG Tbdp Take 4 drops by mouth every morning. Fill the dropper to 1/58mls and it makes 4 drops   VITAMIN D  PO Take 50 mcg by mouth at bedtime.        All past medical history, surgical history, allergies, family history, immunizations andmedications were updated in the EMR today and reviewed under the history and medication portions of their EMR.     ROS Negative, with the exception of above mentioned in HPI   Objective:  BP 120/70   Pulse 64   Temp 97.7 F (36.5 C) (Oral)   Wt 155 lb 6.4 oz (70.5 kg)   SpO2 97%   BMI 25.08 kg/m  Body mass index is 25.08 kg/m.  Physical Exam Vitals and nursing note reviewed.  Constitutional:      General: She is not in acute distress.    Appearance: Normal appearance. She is normal weight. She is not ill-appearing or toxic-appearing.  HENT:     Head: Normocephalic and atraumatic.  Eyes:     General: No scleral icterus.       Right eye: No discharge.        Left eye: No discharge.     Extraocular Movements: Extraocular movements intact.     Conjunctiva/sclera: Conjunctivae normal.     Pupils: Pupils are equal, round, and reactive to light.  Cardiovascular:     Rate and Rhythm: Normal rate and regular rhythm.  Heart sounds: No murmur heard. Musculoskeletal:        General: No swelling or tenderness.     Right lower leg: No edema.     Left lower leg: No edema.  Skin:    Findings: No rash.  Neurological:     Mental Status: She is alert and oriented to person, place, and time. Mental status is at baseline.     Motor: No weakness.     Coordination: Coordination normal.     Gait: Gait normal.  Psychiatric:        Mood and Affect: Mood normal.        Behavior: Behavior normal.        Thought Content: Thought content normal.        Judgment: Judgment normal.      No results found. No results found. No results found for this or any previous visit (from the past 24 hours).  Assessment/Plan: Connie Knox is a 63 y.o. female present for OV for  Dyslipidemia (Primary)/Statin myopathy/Myalgia Hydrate,  - Comp Met (CMET) - CK - pravastatin  Dc'd. This is the 2 nd occurrence of statin myopathy on 2 different statins.  - recommend starting Repatha  q 14 injection for cholesterol control moving forward as long as CMP stable today.  Medication /repatha  was discussed today. If started she will make a nurse visit for proper technique instruction. Follow in 8 weeks after repatha  started.   Reviewed expectations re: course of current medical issues. Discussed self-management of symptoms. Outlined signs and symptoms indicating need for more acute intervention. Patient verbalized understanding and all questions were answered. Patient received an After-Visit Summary.    Orders Placed This Encounter  Procedures   Comp Met (CMET)   CK   No orders of the defined types were placed in this encounter.  Referral Orders  No referral(s) requested today     Note is dictated utilizing voice recognition software. Although note has been proof read prior to signing, occasional typographical errors still can be missed. If any questions arise, please do not hesitate to call for verification.    electronically signed by:  Charlies Bellini, DO  [facility] - [place]

## 2024-07-24 NOTE — Patient Instructions (Addendum)
 We will call you with results and start Repatha  if labs look ok.  Follow up 8 weeks after starting new med for recheck         Great to see you today.  I have refilled the medication(s) we provide.   If labs were collected or images ordered, we will inform you of  results once we have received them and reviewed. We will contact you either by echart message, or telephone call.  Please give ample time to the testing facility, and our office to run,  receive and review results. Please do not call inquiring of results, even if you can see them in your chart. We will contact you as soon as we are able. If it has been over 1 week since the test was completed, and you have not yet heard from us , then please call us .    - echart message- for normal results that have been seen by the patient already.   - telephone call: abnormal results or if patient has not viewed results in their echart.  If a referral to a specialist was entered for you, please call us  in 2 weeks if you have not heard from the specialist office to schedule.

## 2024-07-25 ENCOUNTER — Ambulatory Visit: Payer: Self-pay | Admitting: Family Medicine

## 2024-07-25 MED ORDER — REPATHA SURECLICK 140 MG/ML ~~LOC~~ SOAJ: 140.0000 mg | SUBCUTANEOUS | 5 refills | Status: DC

## 2024-07-26 ENCOUNTER — Telehealth: Payer: Self-pay | Admitting: Family Medicine

## 2024-07-26 ENCOUNTER — Telehealth: Payer: Self-pay

## 2024-07-26 ENCOUNTER — Other Ambulatory Visit (HOSPITAL_COMMUNITY): Payer: Self-pay

## 2024-07-26 DIAGNOSIS — M1611 Unilateral primary osteoarthritis, right hip: Secondary | ICD-10-CM | POA: Insufficient documentation

## 2024-07-26 HISTORY — DX: Unilateral primary osteoarthritis, right hip: M16.11

## 2024-07-26 NOTE — Telephone Encounter (Signed)
 PLEASE BE ADVISED Clinical questions have been answered and PA submitted.TO PLAN. PA currently Pending.

## 2024-07-26 NOTE — Telephone Encounter (Signed)
 Pharmacy Patient Advocate Encounter   Received notification from Pt Calls Messages that prior authorization for Repatha  SureClick 140MG /ML auto-injectors  is required/requested.   Insurance verification completed.   The patient is insured through [facility] .   Per test claim: PA required; PA started via CoverMyMeds. KEY [id number] . Waiting for clinical questions to populate.         Atherosclerotic Disease, Hyperlipidemia

## 2024-07-26 NOTE — Telephone Encounter (Unsigned)
 Copied from CRM [id number]. Topic: Clinical - Medication Prior Auth >> Jul 26, 2024 10:27 AM Gennette ORN wrote: Reason for CRM: Connie Knox [id number] is calling about this prior authorization for Evolocumab  (REPATHA  SURECLICK) 140 MG/ML SOAJ please follow up.

## 2024-07-30 ENCOUNTER — Other Ambulatory Visit: Payer: Self-pay

## 2024-07-30 ENCOUNTER — Other Ambulatory Visit (HOSPITAL_COMMUNITY): Payer: Self-pay

## 2024-07-30 ENCOUNTER — Telehealth: Payer: Self-pay

## 2024-07-30 DIAGNOSIS — M1611 Unilateral primary osteoarthritis, right hip: Secondary | ICD-10-CM | POA: Diagnosis not present

## 2024-07-30 DIAGNOSIS — M87051 Idiopathic aseptic necrosis of right femur: Secondary | ICD-10-CM | POA: Diagnosis not present

## 2024-07-30 NOTE — Telephone Encounter (Signed)
 Pharmacy Patient Advocate Encounter  Received notification from [facility] that Prior Authorization for Repatha  SureClick 140MG /ML auto-injectors  has been APPROVED from 07/26/2024 to 07/26/2025. Ran test claim, Copay is $35.00. This test claim was processed through [facility]- copay amounts may vary at other pharmacies due to pharmacy/plan contracts, or as the patient moves through the different stages of their insurance plan.   PA Case ID #: [id number]

## 2024-07-30 NOTE — Telephone Encounter (Signed)
 07/30/24  2:18 PM Note     Pre-operative Risk Assessment    Patient Name: Connie Knox  DOB: 1961-07-08 MRN: [id number]   Date of last office visit: 06/26/24 Date of next office visit: 09/24/24   Request for Surgical Clearance    Procedure:  Right THA   Date of Surgery:  Clearance TBD                                  Surgeon:  Dr. Fidel Surgeon's Group or Practice Name:  [facility]  Phone number:  [phone] Fax number:  [phone]   Type of Clearance Requested:  Both Medical and Pharmacy Clearance     Type of Anesthesia:  Not Indicated   Additional requests/questions:     Signed, Richard I Cox   07/30/2024, 2:04 PM

## 2024-07-30 NOTE — Telephone Encounter (Signed)
   Pre-operative Risk Assessment    Patient Name: Connie Knox  DOB: 30-Apr-1961 MRN: [id number]   Date of last office visit: 06/26/24 Date of next office visit: 09/24/24   Request for Surgical Clearance    Procedure:  Right THA  Date of Surgery:  Clearance TBD                                Surgeon:  Dr. Fidel Surgeon's Group or Practice Name:  [facility]  Phone number:  [phone] Fax number:  [phone]   Type of Clearance Requested:  Both Medical and Pharmacy Clearance    Type of Anesthesia:  Not Indicated   Additional requests/questions:    Signed, Ezme Duch I Janoah Menna   07/30/2024, 2:04 PM

## 2024-07-31 NOTE — Telephone Encounter (Signed)
   Patient Name: Connie Knox  DOB: 1961/09/17 MRN: [id number]  Primary Cardiologist: None  Chart reviewed as part of pre-operative protocol coverage.   Per Dr. Liborio: From cardiac standpoint moderate nonobstructive coronary artery disease. Empirically started on clopidogrel  [Plavix ] for secondary prevention. From cardiac standpoint okay to proceed with her elective surgery for the hip as being planned without any further additional cardiac workup required at this time unless she is having any new cardiac symptoms. Okay to hold clopidogrel  perioperatively as needed.   I will route this recommendation to the requesting party via Epic fax function and remove from pre-op pool.  Please call with questions.  Arbadella Kimbler D Shawnmichael Parenteau, NP 07/31/2024, 11:34 AM

## 2024-08-03 NOTE — Telephone Encounter (Signed)
 Left message for the patient to call back.

## 2024-08-06 ENCOUNTER — Ambulatory Visit: Admitting: Family Medicine

## 2024-08-06 ENCOUNTER — Ambulatory Visit (INDEPENDENT_AMBULATORY_CARE_PROVIDER_SITE_OTHER)

## 2024-08-06 DIAGNOSIS — T466X5A Adverse effect of antihyperlipidemic and antiarteriosclerotic drugs, initial encounter: Secondary | ICD-10-CM | POA: Diagnosis not present

## 2024-08-06 DIAGNOSIS — G720 Drug-induced myopathy: Secondary | ICD-10-CM | POA: Diagnosis not present

## 2024-08-06 NOTE — Progress Notes (Signed)
 Pt in for teaching on how to administer Repatha  Injection.  1st does administered on left side of abdomen   Pt verbalized understanding and advised to call back with any questions or concerns

## 2024-08-06 NOTE — Telephone Encounter (Signed)
 Left message for the patient to call back.

## 2024-08-07 ENCOUNTER — Telehealth: Payer: Self-pay

## 2024-08-07 ENCOUNTER — Ambulatory Visit: Payer: Self-pay

## 2024-08-07 NOTE — Telephone Encounter (Signed)
Form faxed and pt.aware.

## 2024-08-07 NOTE — Telephone Encounter (Signed)
 Pt made aware forms were faxed.

## 2024-08-07 NOTE — Telephone Encounter (Signed)
 Pt called in and is upset that it is taking so long for her surgical clearance forms to be completed. States that it has been 8 days already and that her cardiologist who is way busier had this completed in 24 hrs. Pt is requesting this to be completed before end of day today and for clinic to reach out to her via mychart

## 2024-08-07 NOTE — Telephone Encounter (Signed)
 Request for surgical clearance was received today from a  [facility]-Dr. Fidel.  All fields must be completed.If fields unknown, surgical office must be contacted. What type of surgery is being performed?  Right total hip arthroplasty What type of anesthesia?  Not listed When is this surgery scheduled?  Not listed Name of physician performing surgery?  Dr. Fidel Patient has been called and appt for surgical clearance has been scheduled: No  Completed form for patient today.

## 2024-08-07 NOTE — Telephone Encounter (Signed)
 FYI Only or Action Required?: Action required by provider: request for documentation or forms.  Patient was last seen in primary care on 07/24/2024 by Catherine Fuller A, DO.  Called Nurse Triage reporting Advice Only.  Symptoms began today.  Interventions attempted: Nothing.  Symptoms are: unchanged.  Triage Disposition: Call PCP When Office is Open  Patient/caregiver understands and will follow disposition?: No, wishes to speak with PCP    Copied from CRM #[id number]. Topic: Clinical - Red Word Triage >> Aug 07, 2024 10:12 AM Jayma L wrote: Red Word that prompted transfer to Nurse Triage: patient called in stated she can't walk anymore and its getting worse she just really needs the hip surgery Reason for Disposition  [1] Caller requesting NON-URGENT health information AND [2] PCP's office is the best resource  Answer Assessment - Initial Assessment Questions 1. REASON FOR CALL: What is the main reason for your call? or How can I best help you?     Calling to check on her surgical clearance forms 2. SYMPTOMS : Do you have any symptoms?      Pain as she has been having and needs surgery for  Protocols used: Information Only Call - No Triage-A-AH

## 2024-08-07 NOTE — Telephone Encounter (Signed)
 Pt called in to let Dr. Liborio that she stopped previous cholesterol medication and start the Repatha  injection.

## 2024-08-07 NOTE — Progress Notes (Signed)
 OK it should be fixed now. If not let me know. Sorry about that.

## 2024-08-13 DIAGNOSIS — M879 Osteonecrosis, unspecified: Secondary | ICD-10-CM | POA: Insufficient documentation

## 2024-08-13 DIAGNOSIS — M1611 Unilateral primary osteoarthritis, right hip: Secondary | ICD-10-CM | POA: Diagnosis not present

## 2024-08-13 DIAGNOSIS — M87051 Idiopathic aseptic necrosis of right femur: Secondary | ICD-10-CM | POA: Diagnosis not present

## 2024-08-17 DIAGNOSIS — M87051 Idiopathic aseptic necrosis of right femur: Secondary | ICD-10-CM | POA: Diagnosis not present

## 2024-11-20 ENCOUNTER — Ambulatory Visit (INDEPENDENT_AMBULATORY_CARE_PROVIDER_SITE_OTHER): Payer: BC Managed Care – PPO | Admitting: Family Medicine

## 2024-11-20 ENCOUNTER — Encounter: Payer: Self-pay | Admitting: Family Medicine

## 2024-11-20 VITALS — BP 120/80 | HR 65 | Temp 98.1°F | Ht 66.0 in | Wt 159.4 lb

## 2024-11-20 DIAGNOSIS — M8589 Other specified disorders of bone density and structure, multiple sites: Secondary | ICD-10-CM | POA: Diagnosis not present

## 2024-11-20 DIAGNOSIS — E538 Deficiency of other specified B group vitamins: Secondary | ICD-10-CM

## 2024-11-20 DIAGNOSIS — Z0000 Encounter for general adult medical examination without abnormal findings: Secondary | ICD-10-CM

## 2024-11-20 DIAGNOSIS — E785 Hyperlipidemia, unspecified: Secondary | ICD-10-CM

## 2024-11-20 DIAGNOSIS — Z131 Encounter for screening for diabetes mellitus: Secondary | ICD-10-CM | POA: Diagnosis not present

## 2024-11-20 DIAGNOSIS — R931 Abnormal findings on diagnostic imaging of heart and coronary circulation: Secondary | ICD-10-CM

## 2024-11-20 DIAGNOSIS — F410 Panic disorder [episodic paroxysmal anxiety] without agoraphobia: Secondary | ICD-10-CM | POA: Diagnosis not present

## 2024-11-20 DIAGNOSIS — B019 Varicella without complication: Secondary | ICD-10-CM | POA: Insufficient documentation

## 2024-11-20 DIAGNOSIS — Z23 Encounter for immunization: Secondary | ICD-10-CM

## 2024-11-20 DIAGNOSIS — F32A Depression, unspecified: Secondary | ICD-10-CM | POA: Insufficient documentation

## 2024-11-20 DIAGNOSIS — M1990 Unspecified osteoarthritis, unspecified site: Secondary | ICD-10-CM | POA: Insufficient documentation

## 2024-11-20 LAB — COMPREHENSIVE METABOLIC PANEL WITH GFR
ALT: 12 U/L (ref 0–35)
AST: 20 U/L (ref 0–37)
Albumin: 4.2 g/dL (ref 3.5–5.2)
Alkaline Phosphatase: 57 U/L (ref 39–117)
BUN: 19 mg/dL (ref 6–23)
CO2: 32 meq/L (ref 19–32)
Calcium: 9.5 mg/dL (ref 8.4–10.5)
Chloride: 104 meq/L (ref 96–112)
Creatinine, Ser: 0.9 mg/dL (ref 0.40–1.20)
GFR: 68.05 mL/min (ref >60.00)
Glucose, Bld: 101 mg/dL — ABNORMAL HIGH (ref 70–99)
Potassium: 4.5 meq/L (ref 3.5–5.1)
Sodium: 141 meq/L (ref 135–145)
Total Bilirubin: 0.6 mg/dL (ref 0.2–1.2)
Total Protein: 6.6 g/dL (ref 6.0–8.3)

## 2024-11-20 LAB — LIPID PANEL
Cholesterol: 210 mg/dL — ABNORMAL HIGH (ref 0–200)
HDL: 73 mg/dL (ref >39.00)
LDL Cholesterol: 114 mg/dL — ABNORMAL HIGH (ref 0–99)
NonHDL: 136.72
Total CHOL/HDL Ratio: 3
Triglycerides: 112 mg/dL (ref 0.0–149.0)
VLDL: 22.4 mg/dL (ref 0.0–40.0)

## 2024-11-20 LAB — CBC
HCT: 37.4 % (ref 36.0–46.0)
Hemoglobin: 12.5 g/dL (ref 12.0–15.0)
MCHC: 33.5 g/dL (ref 30.0–36.0)
MCV: 95.8 fl (ref 78.0–100.0)
Platelets: 211 K/uL (ref 150.0–400.0)
RBC: 3.91 Mil/uL (ref 3.87–5.11)
RDW: 13.6 % (ref 11.5–15.5)
WBC: 4.4 K/uL (ref 4.0–10.5)

## 2024-11-20 LAB — HEMOGLOBIN A1C: Hgb A1c MFr Bld: 5.2 % (ref 4.6–6.5)

## 2024-11-20 LAB — TSH: TSH: 1.85 u[IU]/mL (ref 0.35–5.50)

## 2024-11-20 LAB — VITAMIN D 25 HYDROXY (VIT D DEFICIENCY, FRACTURES): VITD: 67.03 ng/mL (ref 30.00–100.00)

## 2024-11-20 MED ORDER — HYDROXYZINE HCL 10 MG PO TABS: 10.0000 mg | ORAL_TABLET | Freq: Three times a day (TID) | ORAL | 5 refills | Status: AC | PRN

## 2024-11-20 NOTE — Patient Instructions (Addendum)

## 2024-11-20 NOTE — Progress Notes (Signed)
 Patient ID: Connie Knox, female  DOB: 07/18/1961, 63 y.o.   MRN: [id number] Patient Care Team    Relationship Specialty Notifications Start End  Catherine Charlies LABOR, DO PCP - General Family Medicine  06/30/18   Abigail Maude POUR  Optometry  07/03/18   Curlene Agent, MD Consulting Physician Obstetrics and Gynecology  07/03/18   Luis Purchase, MD Consulting Physician Gastroenterology  07/03/18     Chief Complaint  Patient presents with   Annual Exam    Pt is fasting. Influenza vac- declined Prevnar vac- declined    Subjective: Connie Knox is a 63 y.o.  Female  present for Cpe and Chronic Conditions/illness Management All past medical history, surgical history, allergies, family history, immunizations, medications and social history were updated in the electronic medical record today. All recent labs, ED visits and hospitalizations within the last year were reviewed.   Health maintenance:  Colonoscopy: completed by Dr. Luis, colon polyps.  Completed 02/10/2021 Mammogram/cervical cancer screening: completed: By gynecology.  Records requested Immunizations: tdap UTD 12/2020, Influenza declined (encouraged yearly), shingrix completed, prevnar declined Infectious disease screening: HIV completed, Hep C completed DEXA: Patient reports she had her DEXA completed at gynecology. Assistive device: None Oxygen use: None Patient has a Dental home. Hospitalizations/ED visits: Reviewed   Anxiety: she is doing ok, rarely uses vistaril . She does need refills today Prior note: Pt presents for an OV with complaints of increased anxiety with panic attacks of 2 weeks duration. She reports her anxiety started the day of the election. She was feeling palpitations, loopy, headache and she has had intermittent chest discomfort.  She states the symptoms are worse at night, because she is thinking more. She endorses fatigue. She states she is still active and working without issues of chest pain  or dyspnea.  She has noticed she is experiencing more reflux symptoms, despite taking her Protonix  daily. She has been prescribed diclofenac by ortho, she has been taking it for about 4-5 weeks.     Dyslipidemia/statin myopathy: Patient has had statin myopathy . Now established with cardio on repatha  and plavix .      11/20/2024    9:10 AM 04/17/2024    9:01 AM 11/08/2023    1:53 PM 11/08/2023    1:52 PM 07/18/2023    1:34 PM  Depression screen PHQ 2/9  Decreased Interest 0 0 0 0 0  Down, Depressed, Hopeless 0 0 0 0 0  PHQ - 2 Score 0 0 0 0 0  Altered sleeping 0 0     Tired, decreased energy 0 0     Change in appetite 0 0     Feeling bad or failure about yourself  0 0     Trouble concentrating 0 0     Moving slowly or fidgety/restless 0 0     Suicidal thoughts 0 0     PHQ-9 Score 0 0      Difficult doing work/chores Not difficult at all Not difficult at all        Data saved with a previous flowsheet row definition      11/20/2024    9:10 AM 04/17/2024    9:01 AM 11/08/2023    1:53 PM  GAD 7 : Generalized Anxiety Score  Nervous, Anxious, on Edge 0 0 3  Control/stop worrying 0 0 0  Worry too much - different things 0 0 0  Trouble relaxing 0 0 3  Restless 0 0 0  Easily annoyed or irritable  0 0 0  Afraid - awful might happen 0 0 3  Total GAD 7 Score 0 0 9  Anxiety Difficulty Not difficult at all Not difficult at all Not difficult at all       10/03/2020    7:17 PM 07/18/2023    1:34 PM 11/08/2023    1:52 PM 04/17/2024    9:01 AM 11/20/2024    9:09 AM  Fall Risk  Falls in the past year?  0 0 0 0  Was there an injury with Fall?  0  0  0  0  Fall Risk Category Calculator  0 0 0 0  (RETIRED) Patient Fall Risk Level Low fall risk       Patient at Risk for Falls Due to  No Fall Risks No Fall Risks No Fall Risks   Fall risk Follow up  Falls evaluation completed Falls evaluation completed Falls evaluation completed Falls evaluation completed     Data saved with a previous  flowsheet row definition    Immunization History  Administered Date(s) Administered   PFIZER Comirnaty(Gray Top)Covid-19 Tri-Sucrose Vaccine 06/13/2021   PFIZER(Purple Top)SARS-COV-2 Vaccination 02/28/2020, 03/24/2020, 10/10/2020   Tdap 01/12/2021   Zoster Recombinant(Shingrix) 01/16/2021, 02/20/2021    Past Medical History:  Diagnosis Date   Adenomatous colon polyp    Anxiety 10/04/2019   Arthritis    knees (05/12/2016)   BCC (basal cell carcinoma of skin) 12/2018   BCC mid-chest and shin   Chicken pox    Depression    Dyslipidemia    GERD (gastroesophageal reflux disease)    Hearing loss in right ear    chronic   Panic attack    Pubic ramus fracture (HCC) 05/11/2016   fell down carpeted steps.  Pubic ramus fracture, sacral fracture.   Severe anxiety with panic 11/08/2023   Allergies  Allergen Reactions   Pseudoephedrine-Naproxen  Na Er     Other Reaction(s): cardiac arrest   Atorvastatin Other (See Comments)    Goes to her legs and she can't walk   Ciprofloxacin Other (See Comments)    Patient said shes not allergic   Aleve  [Naproxen  Sodium] Palpitations   Past Surgical History:  Procedure Laterality Date   DILATION AND CURETTAGE OF UTERUS  X 9   all miscarriages   Family History  Problem Relation Age of Onset   Lung cancer Maternal Grandmother    Stroke Maternal Grandfather    Ulcers Maternal Grandfather    Breast cancer Paternal Grandmother    Stomach cancer Paternal Grandfather    Social History   Social History Narrative   Married.  2 children.   Bachelors degree.  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   Drinks alcohol socially.  Former smoker.   Exercises routinely.   Eats dairy products.   Drinks caffeine.   Had smoke alarm in the home.   Wears her seatbelt.   Feels safe in her relationships.    Allergies as of 11/20/2024       Reactions   Pseudoephedrine-naproxen  Na Er    Other Reaction(s): cardiac arrest    Atorvastatin Other (See Comments)   Goes to her legs and she can't walk   Ciprofloxacin Other (See Comments)   Patient said shes not allergic   Aleve  [naproxen  Sodium] Palpitations        Medication List        Accurate as of November 20, 2024 11:16 AM. If you have any questions,  ask your nurse or doctor.          clopidogrel  75 MG tablet Commonly known as: PLAVIX  Take 1 tablet (75 mg total) by mouth daily.   Fish Oil 1000 MG Caps Take 1,000 mg by mouth at bedtime.   fluticasone  50 MCG/ACT nasal spray Commonly known as: FLONASE  Place 2 sprays into both nostrils daily.   hydrOXYzine  10 MG tablet Commonly known as: ATARAX  Take 1 tablet (10 mg total) by mouth 3 (three) times daily as needed.   magnesium 84 MG ( ) Tbcr SR tablet Commonly known as: MAGTAB Take 84 mg by mouth.   meclizine  25 MG tablet Commonly known as: ANTIVERT  Take 1 tablet (25 mg total) by mouth 3 (three) times daily as needed for dizziness.   Repatha  SureClick 140 MG/ML Soaj Generic drug: Evolocumab  Inject 140 mg into the skin every 14 (fourteen) days.   Tart Cherry 500 MG Caps Take 2 capsules by mouth at bedtime as needed.   Vitamin B-12 5000 MCG Tbdp Take 4 drops by mouth every morning. Fill the dropper to 1/38mls and it makes 4 drops   VITAMIN D  PO Take 50 mcg by mouth at bedtime.        All past medical history, surgical history, allergies, family history, immunizations andmedications were updated in the EMR today and reviewed under the history and medication portions of their EMR.      ROS: 14 pt review of systems performed and negative (unless mentioned in an HPI)  Objective: BP 120/80   Pulse 65   Temp 98.1 F (36.7 C)   Ht 5' 6 (1.676 m)   Wt 159 lb 6.4 oz (72.3 kg)   SpO2 97%   BMI 25.73 kg/m  Physical Exam Vitals and nursing note reviewed.  Constitutional:      General: She is not in acute distress.    Appearance: Normal appearance. She is not ill-appearing,  toxic-appearing or diaphoretic.  HENT:     Head: Normocephalic and atraumatic.  Eyes:     General: No scleral icterus.       Right eye: No discharge.        Left eye: No discharge.     Extraocular Movements: Extraocular movements intact.     Conjunctiva/sclera: Conjunctivae normal.     Pupils: Pupils are equal, round, and reactive to light.  Cardiovascular:     Rate and Rhythm: Normal rate and regular rhythm.  Pulmonary:     Effort: Pulmonary effort is normal. No respiratory distress.     Breath sounds: Normal breath sounds. No wheezing, rhonchi or rales.  Musculoskeletal:     Right lower leg: No edema.     Left lower leg: No edema.  Skin:    General: Skin is warm.     Findings: No rash.  Neurological:     Mental Status: She is alert and oriented to person, place, and time. Mental status is at baseline.     Motor: No weakness.     Gait: Gait normal.  Psychiatric:        Mood and Affect: Mood normal.        Behavior: Behavior normal.        Thought Content: Thought content normal.        Judgment: Judgment normal.     No results found.  Assessment/plan: REHEMA MUFFLEY is a 63 y.o. female present for CPE and Chronic Conditions/illness Management Dyslipidemia/Elevated coronary artery calcium score; 372 and 95th% Currently taking fish oil  supplementation  Established with cardiology-Repatha  is tolerated Continue Plavix  75 mg daily through cardiology for extensive plaque buildup Encouraged heart healthy diet and routine exercise CAC (06/2024): 372; 95th percentile  Severe anxiety with panic stable Continue vistaril  5 -10 mg prn for panic and sleep Has not needed in a long itme, but keeps with her  Routine general medical examination at a health care facility (Primary) Colonoscopy: completed by Dr. Luis, colon polyps.  Completed 02/10/2021 Mammogram/cervical cancer screening: completed: By gynecology.  Records requested Immunizations: tdap UTD 12/2020, Influenza  declined (encouraged yearly), shingrix completed, prevnar declined Infectious disease screening: HIV completed, Hep C completed DEXA: Patient reports she had her DEXA completed at gynecology. Patient was encouraged to exercise greater than 150 minutes a week. Patient was encouraged to choose a diet filled with fresh fruits and vegetables, and lean meats. AVS provided to patient today for education/recommendation on gender specific health and safety maintenance.  Osteopenia of multiple sites - Vitamin D  (25 hydroxy) -Condition and DEXA screenings managed by gynecology B12 deficiency Continue B12 supplementation Diabetes mellitus screening - Hemoglobin A1c Influenza vaccine needed Declined Need for vaccination for pneumococcus Declined    Return in about 1 year (around 11/21/2025) for cpe (20 min), Routine chronic condition follow-up.  Orders Placed This Encounter  Procedures   CBC   Comprehensive metabolic panel with GFR   Hemoglobin A1c   Lipid panel   TSH   Vitamin D  (25 hydroxy)    Meds ordered this encounter  Medications   hydrOXYzine  (ATARAX ) 10 MG tablet    Sig: Take 1 tablet (10 mg total) by mouth 3 (three) times daily as needed.    Dispense:  90 tablet    Refill:  5   Referral Orders  No referral(s) requested today    Electronically signed by: Charlies Bellini, DO [facility]

## 2024-11-21 ENCOUNTER — Ambulatory Visit

## 2024-11-21 ENCOUNTER — Ambulatory Visit: Payer: Self-pay | Admitting: Family Medicine

## 2024-11-21 VITALS — BP 122/82 | HR 70 | Ht 66.0 in | Wt 155.1 lb

## 2024-11-21 DIAGNOSIS — E782 Mixed hyperlipidemia: Secondary | ICD-10-CM | POA: Diagnosis not present

## 2024-11-21 DIAGNOSIS — I2510 Atherosclerotic heart disease of native coronary artery without angina pectoris: Secondary | ICD-10-CM | POA: Diagnosis not present

## 2024-11-21 NOTE — Assessment & Plan Note (Signed)
 Coronary atherosclerosis noted on calcium score study June 2025 elevated at 372.  With atypical symptoms and nonspecific EKG changes, further evaluated with cardiac CT coronary angiogram 07/03/2024 which once again showed calcium score elevated 390, total plaque volume 520 mm cube, CAD RADS 3 disease with moderate stenosis of proximal LAD, no hemodynamically significant lesions by CT FFR.   Remains asymptomatic. In the setting of significant coronary atherosclerosis noted recommend single antiplatelet agent therapy either aspirin  or Plavix . Tolerating Plavix  75 mg once daily well, continue the same. This can be held as needed for any surgical procedures.  Continue aggressive lipid-lowering therapy. Did not tolerate statins.

## 2024-11-21 NOTE — Progress Notes (Signed)
 Cardiology Consultation:    Date:  11/21/2024   ID:  Connie Knox, DOB 1961/10/09, MRN [id number]  PCP:  Connie Charlies LABOR, DO  Cardiologist:  Connie JONELLE Kobus, MD   Referring MD: Connie Charlies LABOR, DO   No chief complaint on file.    ASSESSMENT AND PLAN:   Connie Knox 63 year old woman history of hyperlipidemia, statin intolerance, GERD, former smoker [quit while in college; smoked for about 5 years], 2 drinks of alcohol per day, avascular necrosis of right hip s/p right total hip arthroplasty 08/17/2024, chronic low back pain related to L5-S1 degenerative disc disease. Coronary atherosclerosis noted on calcium score study June 2025 elevated at 372.  With atypical symptoms and nonspecific EKG changes, further evaluated with cardiac CT coronary angiogram 07/03/2024 which once again showed calcium score elevated 390, total plaque volume 520 mm cube, CAD RADS 3 disease with moderate stenosis of proximal LAD, no hemodynamically significant lesions by CT FFR.  Small hiatal hernia noted.   Here for follow-up visit.  Problem List Items Addressed This Visit     Hyperlipidemia - Primary   Recent lipid panel from 11/20/2024 not at goal. Cannot tolerate statins. Has been on Repatha  for the past 2-1/2 months.  Advised to modify her diet and we will recheck lipid panel tentatively in 4 months prior to her next follow-up visit.  Target LDL less than 70 mg/dL and ideally if possible less than 55 mg/dL.       Relevant Orders   Lipid Profile   CAD (coronary artery disease)   Coronary atherosclerosis noted on calcium score study June 2025 elevated at 372.  With atypical symptoms and nonspecific EKG changes, further evaluated with cardiac CT coronary angiogram 07/03/2024 which once again showed calcium score elevated 390, total plaque volume 520 mm cube, CAD RADS 3 disease with moderate stenosis of proximal LAD, no hemodynamically significant lesions by CT FFR.   Remains asymptomatic. In  the setting of significant coronary atherosclerosis noted recommend single antiplatelet agent therapy either aspirin  or Plavix . Tolerating Plavix  75 mg once daily well, continue the same. This can be held as needed for any surgical procedures.  Continue aggressive lipid-lowering therapy. Did not tolerate statins.     Return to clinic in 4 months.    History of Present Illness:    Connie Knox is a 63 y.o. female who is being seen today for follow-up visit. PCP is Kuneff, Renee A, DO. Last visit with us  in the office was 06/26/2024. Follows up with [facility]  Has history of hyperlipidemia, statin intolerance, GERD, former smoker [quit while in college; smoked for about 5 years], 2 drinks of alcohol per day, avascular necrosis of right hip s/p right total hip arthroplasty 08/17/2024, chronic low back pain related to L5-S1 degenerative disc disease. Coronary atherosclerosis noted on calcium score study June 2025 elevated at 372.  With atypical symptoms and nonspecific EKG changes, further evaluated with cardiac CT coronary angiogram 07/03/2024 which once again showed calcium score elevated 390, total plaque volume 520 mm cube, CAD RADS 3 disease with moderate stenosis of proximal LAD, no hemodynamically significant lesions by CT FFR.  Small hiatal hernia noted.  Pleasant woman here for the visit by herself. Mentions her right hip surgery went well and recovered well.  Has been mobile and fact she came back from a trip out to [place]  and to [place] and was able to hike using a cane up to 6 miles in a day. No significant cardiac symptoms.  Mentions over the Thanksgiving holiday for  the past 2 weeks her diet has not been the best and reports lipid levels down from yesterday are likely off because of this.  Good compliance with her medications.  Did not tolerate statins. Has been on Repatha  for the past 2-1/2 months.  Tolerating well.  Recent blood work from 11/20/2024 shows hemoglobin  12.5, hematocrit 37.4, WBC 4.4 and platelets 211. Sodium 141, potassium 4.5 BUN 19, creatinine 0.9, eGFR 68 Normal transaminases and alkaline phosphatase Hemoglobin A1c 5.2. Lipid panel total cholesterol 210, triglycerides 112, HDL 73 and LDL 114. TSH normal 1.85.  Past Medical History:  Diagnosis Date   Adenomatous colon polyp    Anxiety 10/04/2019   Arthritis    knees (05/12/2016)   B12 deficiency 11/08/2023   BCC (basal cell carcinoma of skin) 12/2018   BCC mid-chest and shin   Chicken pox    Depression    Dyslipidemia    Elevated coronary artery calcium score; 372 and 95th% 05/31/2024   GERD (gastroesophageal reflux disease)    Hand arthritis 01/12/2021   Hearing loss in right ear    chronic   Osteoarthritis of right hip 07/26/2024   Osteonecrosis of hip (HCC) 08/13/2024   Osteopenia 11/27/2020   Panic attack    Pubic ramus fracture (HCC) 05/11/2016   fell down carpeted steps.  Pubic ramus fracture, sacral fracture.   Severe anxiety with panic 11/08/2023   Statin myopathy 11/09/2023    Past Surgical History:  Procedure Laterality Date   DILATION AND CURETTAGE OF UTERUS  X 9   all miscarriages    Current Medications: Current Meds  Medication Sig   clopidogrel  (PLAVIX ) 75 MG tablet Take 1 tablet (75 mg total) by mouth daily.   Cyanocobalamin  (VITAMIN B-12) 5000 MCG TBDP Take 4 drops by mouth every morning. Fill the dropper to 1/36mls and it makes 4 drops   Evolocumab  (REPATHA  SURECLICK) 140 MG/ML SOAJ Inject 140 mg into the skin every 14 (fourteen) days.   hydrOXYzine  (ATARAX ) 10 MG tablet Take 1 tablet (10 mg total) by mouth 3 (three) times daily as needed.   magnesium (MAGTAB) 84 MG ( ) TBCR SR tablet Take 84 mg by mouth.   meclizine  (ANTIVERT ) 25 MG tablet Take 1 tablet (25 mg total) by mouth 3 (three) times daily as needed for dizziness.   Omega-3 Fatty Acids (FISH OIL) 1000 MG CAPS Take 1,000 mg by mouth at bedtime.   Tart Cherry 500 MG CAPS Take 2  capsules by mouth at bedtime as needed.   VITAMIN D  PO Take 50 mcg by mouth at bedtime.     Allergies:   Pseudoephedrine-naproxen  na er, Atorvastatin, Ciprofloxacin, and Aleve  [naproxen  sodium]   Social History   Socioeconomic History   Marital status: Married    Spouse name: Not on file   Number of children: Not on file   Years of education: Not on file   Highest education level: Bachelor's degree (e.g., BA, AB, BS)  Occupational History   Not on file  Tobacco Use   Smoking status: Former   Smokeless tobacco: Never   Tobacco comments:    smoked cigarettes as a teen  Vaping Use   Vaping status: Never Used  Substance and Sexual Activity   Alcohol use: Yes    Alcohol/week: 14.0 standard drinks of alcohol    Types: 14 Shots of liquor per week    Comment: 05/12/2016 2 shots vodka/day   Drug use: No   Sexual activity:  Yes    Partners: Male  Other Topics Concern   Not on file  Social History Narrative   Married.  2 children.   Bachelors degree.  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   Drinks alcohol socially.  Former smoker.   Exercises routinely.   Eats dairy products.   Drinks caffeine.   Had smoke alarm in the home.   Wears her seatbelt.   Feels safe in her relationships.   Social Drivers of Corporate Investment Banker Strain: Low Risk  (04/13/2024)   Overall Financial Resource Strain (CARDIA)    Difficulty of Paying Living Expenses: Not hard at all  Food Insecurity: No Food Insecurity (04/13/2024)   Hunger Vital Sign    Worried About Running Out of Food in the Last Year: Never true    Ran Out of Food in the Last Year: Never true  Transportation Needs: No Transportation Needs (04/13/2024)   PRAPARE - Administrator, Civil Service (Medical): No    Lack of Transportation (Non-Medical): No  Physical Activity: Unknown (04/13/2024)   Exercise Vital Sign    Days of Exercise per Week: 0 days    Minutes of Exercise per Session:  Not on file  Stress: No Stress Concern Present (04/13/2024)   [employer] of Occupational Health - Occupational Stress Questionnaire    Feeling of Stress : Only a little  Social Connections: Socially Isolated (04/13/2024)   Social Connection and Isolation Panel    Frequency of Communication with Friends and Family: Once a week    Frequency of Social Gatherings with Friends and Family: Once a week    Attends Religious Services: Never    Database Administrator or Organizations: No    Attends Engineer, Structural: Not on file    Marital Status: Married     Family History: The patient's family history includes Breast cancer in her paternal grandmother; Lung cancer in her maternal grandmother; Stomach cancer in her paternal grandfather; Stroke in her maternal grandfather; Ulcers in her maternal grandfather. ROS:   Please see the history of present illness.    All 14 point review of systems negative except as described per history of present illness.  EKGs/Labs/Other Studies Reviewed:    The following studies were reviewed today:   EKG:       Recent Labs: 02/03/2024: Magnesium 2.0 11/20/2024: ALT 12; BUN 19; Creatinine, Ser 0.90; Hemoglobin 12.5; Platelets 211.0; Potassium 4.5; Sodium 141; TSH 1.85  Recent Lipid Panel    Component Value Date/Time   CHOL 210 (H) 11/20/2024 0910   TRIG 112.0 11/20/2024 0910   HDL 73.00 11/20/2024 0910   CHOLHDL 3 11/20/2024 0910   VLDL 22.4 11/20/2024 0910   LDLCALC 114 (H) 11/20/2024 0910   LDLCALC 158 (H) 01/12/2021 1404    Physical Exam:    VS:  BP 122/82   Pulse 70   Ht 5' 6 (1.676 m)   Wt 155 lb 1.9 oz (70.4 kg)   SpO2 99%   BMI 25.04 kg/m     Wt Readings from Last 3 Encounters:  11/21/24 155 lb 1.9 oz (70.4 kg)  11/20/24 159 lb 6.4 oz (72.3 kg)  07/24/24 155 lb 6.4 oz (70.5 kg)     GENERAL:  Well nourished, well developed in no acute distress NECK: No JVD; No carotid bruits CARDIAC: RRR, S1 and S2 present, no  murmurs, no rubs, no gallops Extremities: No pitting pedal edema.  Pulses bilaterally symmetric with radial 2+ and dorsalis pedis 2+ NEUROLOGIC:  Alert and oriented x 3  Medication Adjustments/Labs and Tests Ordered: Current medicines are reviewed at length with the patient today.  Concerns regarding medicines are outlined above.  Orders Placed This Encounter  Procedures   Lipid Profile   No orders of the defined types were placed in this encounter.   Signed, Marizol Borror reddy Aubry Tucholski, MD, MPH, [facility]. 11/21/2024 1:30 PM    [facility]

## 2024-11-21 NOTE — Patient Instructions (Signed)
 Medication Instructions:  Your physician recommends that you continue on your current medications as directed. Please refer to the Current Medication list given to you today.  *If you need a refill on your cardiac medications before your next appointment, please call your pharmacy*  Lab Work: Your physician recommends that you return for lab work in:   Labs 1 week before next office visit: Lipid  If you have labs (blood work) drawn today and your tests are completely normal, you will receive your results only by: MyChart Message (if you have MyChart) OR A paper copy in the mail If you have any lab test that is abnormal or we need to change your treatment, we will call you to review the results.  Testing/Procedures: None  Follow-Up: At [facility], you and your health needs are our priority.  As part of our continuing mission to provide you with exceptional heart care, our providers are all part of one team.  This team includes your primary Cardiologist (physician) and Advanced Practice Providers or APPs (Physician Assistants and Nurse Practitioners) who all work together to provide you with the care you need, when you need it.  Your next appointment:   4 month(s)  Provider:   Alean Kobus, MD    We recommend signing up for the patient portal called MyChart.  Sign up information is provided on this After Visit Summary.  MyChart is used to connect with patients for Virtual Visits (Telemedicine).  Patients are able to view lab/test results, encounter notes, upcoming appointments, etc.  Non-urgent messages can be sent to your provider as well.   To learn more about what you can do with MyChart, go to [email or web address].   Other Instructions None

## 2024-11-21 NOTE — Assessment & Plan Note (Signed)
 Recent lipid panel from 11/20/2024 not at goal. Cannot tolerate statins. Has been on Repatha  for the past 2-1/2 months.  Advised to modify her diet and we will recheck lipid panel tentatively in 4 months prior to her next follow-up visit.  Target LDL less than 70 mg/dL and ideally if possible less than 55 mg/dL.

## 2024-11-24 DIAGNOSIS — M5416 Radiculopathy, lumbar region: Secondary | ICD-10-CM | POA: Diagnosis not present

## 2024-11-30 ENCOUNTER — Telehealth: Payer: Self-pay

## 2024-11-30 NOTE — Telephone Encounter (Signed)
 Taking the medication just 2 days early should not cause any issues.  She could also consider taking medication when she gets home from [place] instead.

## 2024-11-30 NOTE — Telephone Encounter (Signed)
 Pt aware and verbalized understanding.

## 2024-11-30 NOTE — Telephone Encounter (Signed)
 Communication  Reason for CRM: Pt stated that she is leaving next Saturday to go to [place]. Pt stated that she is currently taking (REPATHA  SURECLICK) 140 MG and wants to know if she can take this medication on next Saturday which will be 2 days early because she normally takes it on Monday every 2 weeks. Pt stated that the medication has to be refrigerated but she will be unable to do that since she will be on a plane and stated that she is unable to take the medication on the plane. Pt wants to know if she can take this medication 2 days early or take it a week late and then resume the every 2 weeks. Pt would like a callback with an update.   Please advise.

## 2024-12-24 ENCOUNTER — Ambulatory Visit: Payer: Self-pay

## 2024-12-24 NOTE — Telephone Encounter (Signed)
 Lingering cough from cold in Nov, hard to take a deep breath, no fever  FYI Only or Action Required?: FYI only for provider: appointment scheduled on 12/25/24.  Patient was last seen in primary care on 11/20/2024 by Catherine Fuller A, DO.  Called Nurse Triage reporting Cough.  Symptoms began Thanksgiving.  Interventions attempted: Nothing.  Symptoms are: unchanged.  Triage Disposition: See PCP Within 2 Weeks  Patient/caregiver understands and will follow disposition?: Yes   Copied from CRM [id number]. Topic: Clinical - Red Word Triage >> Dec 24, 2024  9:08 AM Frederich PARAS wrote: [facility] that prompted transfer to Nurse Triage: TROUBLE TAKING DEEP BREATH  COUPGH OVER A MONTH, TAKING A DEEP BVREATH IS HARD, FEELS TIGHT, CANT TAKE A DEEP BREATH, MOSTLY COUGH Reason for Disposition  Cough lasts > 3 weeks  Answer Assessment - Initial Assessment Questions 1. ONSET: When did the cough begin?      Started around Thanksgiving  2. SEVERITY: How bad is the cough today?      Coughs once or twice an hour  3. SPUTUM: Describe the color of your sputum (e.g., none, dry cough; clear, white, yellow, green)     Reports phlegm once a day  4. HEMOPTYSIS: Are you coughing up any blood? If Yes, ask: How much? (e.g., flecks, streaks, tablespoons, etc.)     Denies  5. DIFFICULTY BREATHING: Are you having difficulty breathing? If Yes, ask: How bad is it? (e.g., mild, moderate, severe)      *No Answer*  6. FEVER: Do you have a fever? If Yes, ask: What is your temperature, how was it measured, and when did it start?     Denies  7. CARDIAC HISTORY: Do you have any history of heart disease? (e.g., heart attack, congestive heart failure)      *No Answer*  8. LUNG HISTORY: Do you have any history of lung disease?  (e.g., pulmonary embolus, asthma, emphysema)     *No Answer*  9. PE RISK FACTORS: Do you have a history of blood clots? (or: recent major surgery, recent prolonged travel,  bedridden)     *No Answer*  10. OTHER SYMPTOMS: Do you have any other symptoms? (e.g., runny nose, wheezing, chest pain)       *No Answer*  11. PREGNANCY: Is there any chance you are pregnant? When was your last menstrual period?       *No Answer* 12. TRAVEL: Have you traveled out of the country in the last month? (e.g., travel history, exposures)       *No Answer*  Answer Assessment - Initial Assessment Questions 1. ONSET: When did the cough begin?      Since Thanksgiving  2. SEVERITY: How bad is the cough today?      Mild, reports she coughs 1-2 times an hour, denies coughing spells   3. SPUTUM: Describe the color of your sputum (e.g., none, dry cough; clear, white, yellow, green)     None   4. HEMOPTYSIS: Are you coughing up any blood? If so ask: How much? (e.g., flecks, streaks, tablespoons, etc.)     Denies  5. DIFFICULTY BREATHING: Are you having difficulty breathing? If Yes, ask: How bad is it? (e.g., mild, moderate, severe)      Pt states it feels hard to take a deep breath, and feels like the time she had walking pneumonia. Pt speaking comfortably in multiple full sentences at a time. No shortness of breath is noted.   6. FEVER: Do you have a  fever? If Yes, ask: What is your temperature, how was it measured, and when did it start?     Denies   7. CARDIAC HISTORY: Do you have any history of heart disease? (e.g., heart attack, congestive heart failure)      CAD  8. LUNG HISTORY: Do you have any history of lung disease?  (e.g., pulmonary embolus, asthma, emphysema)     Negative  10. OTHER SYMPTOMS: Do you have any other symptoms? (e.g., runny nose, wheezing, chest pain)       Pt reports she has a UTI at this time.  Protocols used: Cough - Acute Non-Productive-A-AH, Cough - Chronic-A-AH

## 2024-12-24 NOTE — Telephone Encounter (Signed)
 No further action needed at this time. Appt made.

## 2024-12-25 ENCOUNTER — Encounter: Payer: Self-pay | Admitting: Family Medicine

## 2024-12-25 ENCOUNTER — Ambulatory Visit: Admitting: Family Medicine

## 2024-12-25 VITALS — BP 122/74 | HR 69 | Temp 98.1°F | Wt 157.8 lb

## 2024-12-25 DIAGNOSIS — R0982 Postnasal drip: Secondary | ICD-10-CM

## 2024-12-25 DIAGNOSIS — N3090 Cystitis, unspecified without hematuria: Secondary | ICD-10-CM

## 2024-12-25 MED ORDER — NITROFURANTOIN MONOHYD MACRO 100 MG PO CAPS: 100.0000 mg | ORAL_CAPSULE | Freq: Two times a day (BID) | ORAL | 0 refills | Status: AC

## 2024-12-25 MED ORDER — IPRATROPIUM BROMIDE 0.06 % NA SOLN: 2.0000 | Freq: Four times a day (QID) | NASAL | 12 refills | Status: AC

## 2024-12-25 NOTE — Progress Notes (Signed)
 "      Connie Knox , [birth year], 64 y.o., female MRN: [id number] Patient Care Team    Relationship Specialty Notifications Start End  Catherine Charlies LABOR, DO PCP - General Family Medicine  06/30/18   Abigail Maude POUR  Optometry  07/03/18   Curlene Agent, MD Consulting Physician Obstetrics and Gynecology  07/03/18   Luis Purchase, MD Consulting Physician Gastroenterology  07/03/18     Chief Complaint  Patient presents with   Cough    Ongoing since end of November. Dry cough. Pt has tried Theraflu.      Subjective: Connie Knox is a 64 y.o. Pt presents for an OV with complaints of dry cough, that has been present since early November.  She reports at the onset of symptoms she did have a viral-like illness, but all symptoms had resolved except for the cough has remained and is persistent.  She reports cough occurs at anytime of the day all day long.  At the time of her symptoms she did take TheraFlu.  She denies any recent fever, chills or shortness of breath.  She reports she was concerned because last time this occurred she had walking pneumonia. Patient also reports she has had urinary frequency and dysuria of over 1 week.  She was on vacation and started an over-the-counter product to help with her symptoms.  She reports the product is finished today and her symptoms are not resolving.     11/20/2024    9:10 AM 04/17/2024    9:01 AM 11/08/2023    1:53 PM 11/08/2023    1:52 PM 07/18/2023    1:34 PM  Depression screen PHQ 2/9  Decreased Interest 0 0 0 0 0  Down, Depressed, Hopeless 0 0 0 0 0  PHQ - 2 Score 0 0 0 0 0  Altered sleeping 0 0     Tired, decreased energy 0 0     Change in appetite 0 0     Feeling bad or failure about yourself  0 0     Trouble concentrating 0 0     Moving slowly or fidgety/restless 0 0     Suicidal thoughts 0 0     PHQ-9 Score 0 0      Difficult doing work/chores Not difficult at all Not difficult at all        Data saved with a previous  flowsheet row definition    Allergies[1] Social History   Social History Narrative   Married.  2 children.   Bachelors degree.  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   Drinks alcohol socially.  Former smoker.   Exercises routinely.   Eats dairy products.   Drinks caffeine.   Had smoke alarm in the home.   Wears her seatbelt.   Feels safe in her relationships.   Past Medical History:  Diagnosis Date   Adenomatous colon polyp    Anxiety 10/04/2019   Arthritis    knees (05/12/2016)   B12 deficiency 11/08/2023   BCC (basal cell carcinoma of skin) 12/2018   BCC mid-chest and shin   Chicken pox    Depression    Dyslipidemia    Elevated coronary artery calcium score; 372 and 95th% 05/31/2024   GERD (gastroesophageal reflux disease)    Hand arthritis 01/12/2021   Hearing loss in right ear    chronic   Osteoarthritis of right hip 07/26/2024   Osteonecrosis of hip (HCC) 08/13/2024  Osteopenia 11/27/2020   Panic attack    Pubic ramus fracture (HCC) 05/11/2016   fell down carpeted steps.  Pubic ramus fracture, sacral fracture.   Severe anxiety with panic 11/08/2023   Statin myopathy 11/09/2023   Past Surgical History:  Procedure Laterality Date   DILATION AND CURETTAGE OF UTERUS  X 9   all miscarriages   Family History  Problem Relation Age of Onset   Lung cancer Maternal Grandmother    Stroke Maternal Grandfather    Ulcers Maternal Grandfather    Breast cancer Paternal Grandmother    Stomach cancer Paternal Grandfather    Allergies as of 12/25/2024       Reactions   Pseudoephedrine-naproxen  Na Er    Other Reaction(s): cardiac arrest   Atorvastatin Other (See Comments)   Goes to her legs and she can't walk   Ciprofloxacin Other (See Comments)   Patient said shes not allergic   Aleve  [naproxen  Sodium] Palpitations        Medication List        Accurate as of December 25, 2024  3:20 PM. If you have any questions, ask your nurse  or doctor.          clopidogrel  75 MG tablet Commonly known as: PLAVIX  Take 1 tablet (75 mg total) by mouth daily.   Fish Oil 1000 MG Caps Take 1,000 mg by mouth at bedtime.   hydrOXYzine  10 MG tablet Commonly known as: ATARAX  Take 1 tablet (10 mg total) by mouth 3 (three) times daily as needed.   ipratropium 0.06 % nasal spray Commonly known as: ATROVENT  Place 2 sprays into both nostrils 4 (four) times daily. Started by: Charlies Bellini, DO   magnesium 84 MG ( ) Tbcr SR tablet Commonly known as: MAGTAB Take 84 mg by mouth.   meclizine  25 MG tablet Commonly known as: ANTIVERT  Take 1 tablet (25 mg total) by mouth 3 (three) times daily as needed for dizziness.   nitrofurantoin  (macrocrystal-monohydrate) 100 MG capsule Commonly known as: Macrobid  Take 1 capsule (100 mg total) by mouth 2 (two) times daily. Started by: Charlies Bellini, DO   Repatha  SureClick 140 MG/ML Soaj Generic drug: Evolocumab  Inject 140 mg into the skin every 14 (fourteen) days.   Tart Cherry 500 MG Caps Take 2 capsules by mouth at bedtime as needed.   Vitamin B-12 5000 MCG Tbdp Take 4 drops by mouth every morning. Fill the dropper to 1/11mls and it makes 4 drops   VITAMIN D  PO Take 50 mcg by mouth at bedtime.        All past medical history, surgical history, allergies, family history, immunizations andmedications were updated in the EMR today and reviewed under the history and medication portions of their EMR.     Review of Systems  Constitutional:  Positive for malaise/fatigue. Negative for chills and fever.  HENT:  Positive for ear pain. Negative for congestion, sinus pain and sore throat.   Eyes: Negative.   Respiratory:  Positive for cough. Negative for sputum production, shortness of breath and wheezing.   Cardiovascular: Negative.   Gastrointestinal: Negative.  Negative for abdominal pain, nausea and vomiting.  Genitourinary:  Positive for dysuria, frequency and urgency.   Musculoskeletal:  Negative for myalgias.  Skin:  Negative for rash.  Neurological:  Negative for dizziness and headaches.   Negative, with the exception of above mentioned in HPI   Objective:  BP 122/74   Pulse 69   Temp 98.1 F (36.7 C)   Wt 157  lb 12.8 oz (71.6 kg)   SpO2 96%   BMI 25.47 kg/m  Body mass index is 25.47 kg/m. Physical Exam Vitals and nursing note reviewed.  Constitutional:      General: She is not in acute distress.    Appearance: Normal appearance. She is not ill-appearing, toxic-appearing or diaphoretic.  HENT:     Head: Normocephalic and atraumatic.     Right Ear: Tympanic membrane, ear canal and external ear normal.     Left Ear: Tympanic membrane, ear canal and external ear normal.     Nose: Rhinorrhea present. No congestion.     Comments: Moderate postnasal drip present    Mouth/Throat:     Pharynx: No oropharyngeal exudate or posterior oropharyngeal erythema.  Eyes:     General: No scleral icterus.       Right eye: No discharge.        Left eye: No discharge.     Extraocular Movements: Extraocular movements intact.     Conjunctiva/sclera: Conjunctivae normal.     Pupils: Pupils are equal, round, and reactive to light.  Cardiovascular:     Rate and Rhythm: Normal rate and regular rhythm.     Heart sounds: No murmur heard. Pulmonary:     Effort: Pulmonary effort is normal. No respiratory distress.     Breath sounds: Normal breath sounds. No wheezing, rhonchi or rales.  Abdominal:     General: Abdomen is flat.     Palpations: Abdomen is soft.     Tenderness: There is no right CVA tenderness, left CVA tenderness, guarding or rebound.  Musculoskeletal:     Cervical back: Neck supple.     Right lower leg: No edema.     Left lower leg: No edema.  Lymphadenopathy:     Cervical: No cervical adenopathy.  Skin:    General: Skin is warm.     Findings: No rash.  Neurological:     Mental Status: She is alert and oriented to person, place, and  time. Mental status is at baseline.     Motor: No weakness.     Gait: Gait normal.  Psychiatric:        Mood and Affect: Mood normal.        Behavior: Behavior normal.        Thought Content: Thought content normal.        Judgment: Judgment normal.      No results found. No results found. No results found for this or any previous visit (from the past 24 hours).  Assessment/Plan: Connie Knox is a 64 y.o. female present for OV for  PND (post-nasal drip) (Primary) Lung exam is normal.  She does have significant amount of postnasal drip present.  Believe this is the cause of her ongoing cough. Encouraged her to start her hydroxyzine  1 tab nightly for the next few weeks Prescribed Atrovent  nasal spray 4 times daily as needed  Cystitis We discussed sending urine culture and if positive could consider prophylactic postcoital Macrobid  prescription.  For now we will treat with Macrobid  twice daily x 5 days while we wait on culture. - Urine Culture - nitrofurantoin , macrocrystal-monohydrate, (MACROBID ) 100 MG capsule; Take 1 capsule (100 mg total) by mouth 2 (two) times daily.  Dispense: 10 capsule; Refill: 0  Reviewed expectations re: course of current medical issues. Discussed self-management of symptoms. Outlined signs and symptoms indicating need for more acute intervention. Patient verbalized understanding and all questions were answered. Patient received an After-Visit Summary.  Orders Placed This Encounter  Procedures   Urine Culture   Meds ordered this encounter  Medications   nitrofurantoin , macrocrystal-monohydrate, (MACROBID ) 100 MG capsule    Sig: Take 1 capsule (100 mg total) by mouth 2 (two) times daily.    Dispense:  10 capsule    Refill:  0   ipratropium (ATROVENT ) 0.06 % nasal spray    Sig: Place 2 sprays into both nostrils 4 (four) times daily.    Dispense:  15 mL    Refill:  12   Referral Orders  No referral(s) requested today     Note is  dictated utilizing voice recognition software. Although note has been proof read prior to signing, occasional typographical errors still can be missed. If any questions arise, please do not hesitate to call for verification.   electronically signed by:  Charlies Bellini, DO  [facility] [place]       [1]  Allergies Allergen Reactions   Pseudoephedrine-Naproxen  Na Er     Other Reaction(s): cardiac arrest   Atorvastatin Other (See Comments)    Goes to her legs and she can't walk   Ciprofloxacin Other (See Comments)    Patient said shes not allergic   Aleve  [Naproxen  Sodium] Palpitations   "

## 2024-12-25 NOTE — Patient Instructions (Signed)

## 2024-12-26 LAB — URINE CULTURE
MICRO NUMBER:: 17432167
SPECIMEN QUALITY:: ADEQUATE

## 2024-12-27 ENCOUNTER — Ambulatory Visit: Payer: Self-pay | Admitting: Family Medicine

## 2024-12-27 ENCOUNTER — Encounter: Payer: Self-pay | Admitting: Family Medicine

## 2025-01-03 ENCOUNTER — Other Ambulatory Visit: Payer: Self-pay

## 2025-01-03 MED ORDER — REPATHA SURECLICK 140 MG/ML ~~LOC~~ SOAJ: 140.0000 mg | SUBCUTANEOUS | 2 refills | Status: AC

## 2025-01-03 NOTE — Telephone Encounter (Signed)
 Patient called in stating that Pharmacy told her that she's not have any refills available. Patient is confused as to what is going on. Patient would like to know if office could reach out to pharmacy to see what's going on.
